# Patient Record
Sex: Female | Born: 1970 | Race: Black or African American | Hispanic: No | Smoking: Never smoker
Health system: Southern US, Community
[De-identification: ages and names within clinical notes are randomized; demographics above are authoritative.]

## PROBLEM LIST (undated history)

## (undated) DIAGNOSIS — D6859 Other primary thrombophilia: Secondary | ICD-10-CM

## (undated) DIAGNOSIS — I2699 Other pulmonary embolism without acute cor pulmonale: Secondary | ICD-10-CM

## (undated) DIAGNOSIS — Z9109 Other allergy status, other than to drugs and biological substances: Secondary | ICD-10-CM

## (undated) DIAGNOSIS — T7840XA Allergy, unspecified, initial encounter: Secondary | ICD-10-CM

## (undated) DIAGNOSIS — I82409 Acute embolism and thrombosis of unspecified deep veins of unspecified lower extremity: Secondary | ICD-10-CM

## (undated) HISTORY — DX: Allergy, unspecified, initial encounter: T78.40XA

---

## 1999-04-13 ENCOUNTER — Emergency Department (HOSPITAL_COMMUNITY): Admission: EM | Admit: 1999-04-13 | Discharge: 1999-04-14 | Payer: Self-pay | Admitting: *Deleted

## 1999-05-29 ENCOUNTER — Emergency Department (HOSPITAL_COMMUNITY): Admission: EM | Admit: 1999-05-29 | Discharge: 1999-05-29 | Payer: Self-pay | Admitting: *Deleted

## 2001-06-11 ENCOUNTER — Other Ambulatory Visit: Admission: RE | Admit: 2001-06-11 | Discharge: 2001-06-11 | Payer: Self-pay | Admitting: *Deleted

## 2001-08-29 ENCOUNTER — Inpatient Hospital Stay (HOSPITAL_COMMUNITY): Admission: EM | Admit: 2001-08-29 | Discharge: 2001-09-04 | Payer: Self-pay | Admitting: Emergency Medicine

## 2001-09-14 ENCOUNTER — Ambulatory Visit (HOSPITAL_COMMUNITY): Admission: RE | Admit: 2001-09-14 | Discharge: 2001-09-14 | Payer: Self-pay

## 2001-09-21 ENCOUNTER — Ambulatory Visit (HOSPITAL_COMMUNITY): Admission: RE | Admit: 2001-09-21 | Discharge: 2001-09-21 | Payer: Self-pay | Admitting: *Deleted

## 2002-04-18 ENCOUNTER — Other Ambulatory Visit: Admission: RE | Admit: 2002-04-18 | Discharge: 2002-04-18 | Payer: Self-pay | Admitting: Internal Medicine

## 2003-05-21 ENCOUNTER — Other Ambulatory Visit: Admission: RE | Admit: 2003-05-21 | Discharge: 2003-05-21 | Payer: Self-pay | Admitting: Obstetrics and Gynecology

## 2004-06-30 ENCOUNTER — Other Ambulatory Visit: Admission: RE | Admit: 2004-06-30 | Discharge: 2004-06-30 | Payer: Self-pay | Admitting: Obstetrics and Gynecology

## 2005-04-19 ENCOUNTER — Inpatient Hospital Stay (HOSPITAL_COMMUNITY): Admission: AD | Admit: 2005-04-19 | Discharge: 2005-04-20 | Payer: Self-pay | Admitting: *Deleted

## 2005-08-09 ENCOUNTER — Other Ambulatory Visit: Admission: RE | Admit: 2005-08-09 | Discharge: 2005-08-09 | Payer: Self-pay | Admitting: Obstetrics and Gynecology

## 2006-10-23 ENCOUNTER — Emergency Department (HOSPITAL_COMMUNITY): Admission: EM | Admit: 2006-10-23 | Discharge: 2006-10-23 | Payer: Self-pay | Admitting: Emergency Medicine

## 2007-08-18 ENCOUNTER — Emergency Department (HOSPITAL_COMMUNITY): Admission: EM | Admit: 2007-08-18 | Discharge: 2007-08-18 | Payer: Self-pay | Admitting: Emergency Medicine

## 2007-10-03 ENCOUNTER — Encounter: Admission: RE | Admit: 2007-10-03 | Discharge: 2007-10-03 | Payer: Self-pay | Admitting: Obstetrics and Gynecology

## 2007-10-12 ENCOUNTER — Encounter (INDEPENDENT_AMBULATORY_CARE_PROVIDER_SITE_OTHER): Payer: Self-pay | Admitting: Diagnostic Radiology

## 2007-10-12 ENCOUNTER — Encounter: Admission: RE | Admit: 2007-10-12 | Discharge: 2007-10-12 | Payer: Self-pay | Admitting: Obstetrics and Gynecology

## 2007-10-26 ENCOUNTER — Emergency Department (HOSPITAL_COMMUNITY): Admission: EM | Admit: 2007-10-26 | Discharge: 2007-10-27 | Payer: Self-pay | Admitting: Emergency Medicine

## 2008-02-10 ENCOUNTER — Encounter (INDEPENDENT_AMBULATORY_CARE_PROVIDER_SITE_OTHER): Payer: Self-pay | Admitting: Emergency Medicine

## 2008-02-10 ENCOUNTER — Emergency Department (HOSPITAL_COMMUNITY): Admission: EM | Admit: 2008-02-10 | Discharge: 2008-02-10 | Payer: Self-pay | Admitting: Emergency Medicine

## 2008-02-10 ENCOUNTER — Ambulatory Visit: Payer: Self-pay | Admitting: *Deleted

## 2009-12-29 IMAGING — CT CT ABDOMEN W/ CM
2 of 5 series · 17 of 46 positions shown, 19 images · IV contrast (APPLIED)
Comparison: None.

ABDOMEN CT WITH CONTRAST:

CLINICAL DATA: Right-sided abdominal pain. Nausea. Fever. Question appendicitis.
TECHNIQUE: Multidetector CT imaging of the abdomen and pelvis was performed
following the standard protocol during bolus administration of intravenous
contrast.

Contrast:  125 cc Omnipaque 300

[Series 2: abd_pel 5.0 b40f st · axial · 0.73mm/px · z∈[+1023,+1453]mm · 14 of 96 slices shown, 16 images]
[im 5/96  soft-tissue]
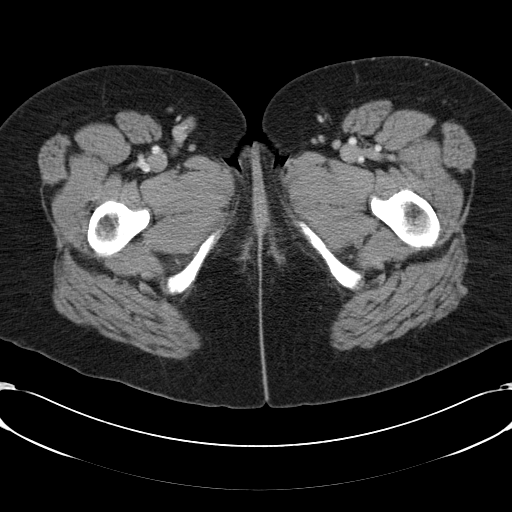
[im 5/96  bone]
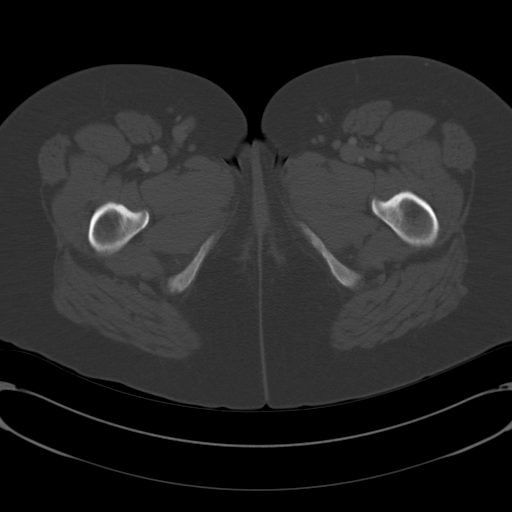
[im 14/96  soft-tissue]
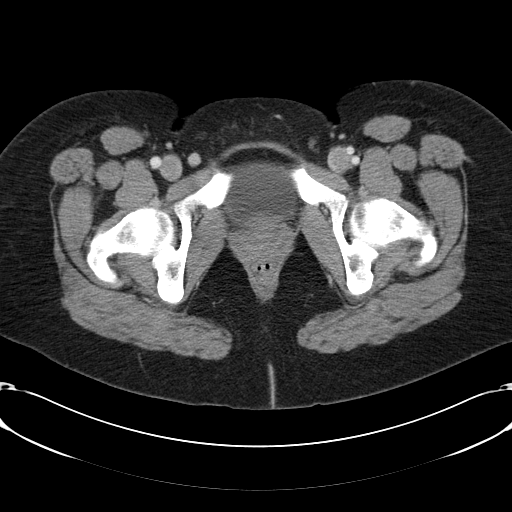
[im 19/96  soft-tissue]
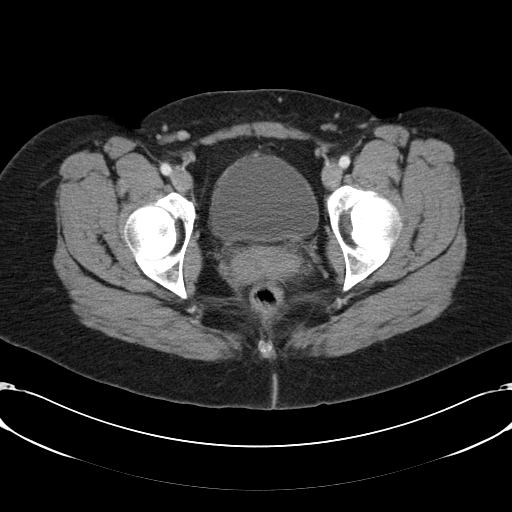
[im 28/96  soft-tissue]
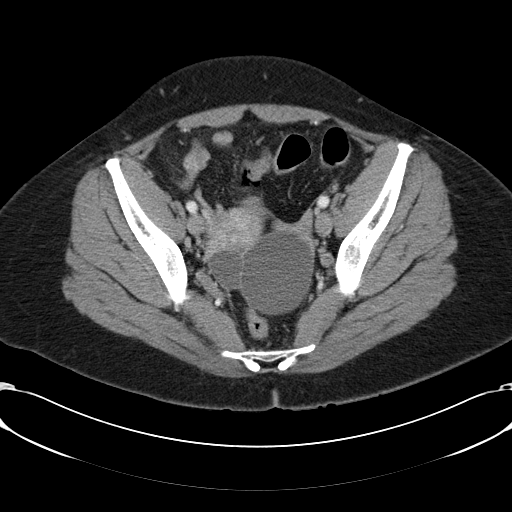
[im 32/96  soft-tissue]
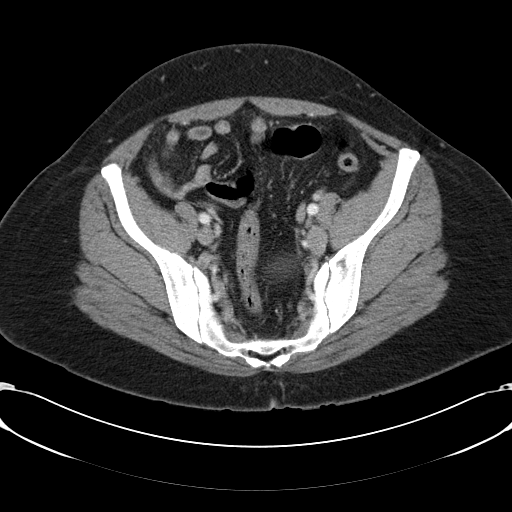
[im 37/96  soft-tissue]
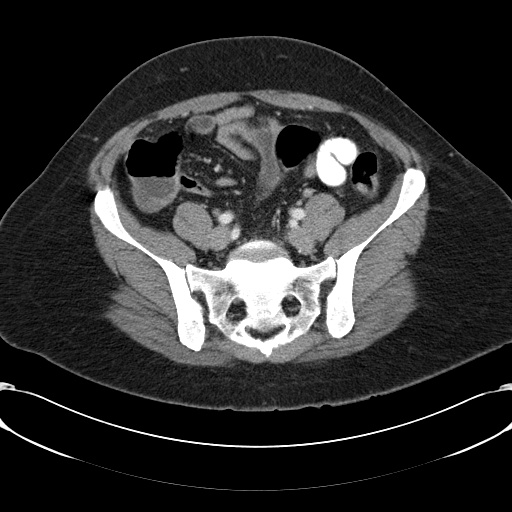
[im 46/96  soft-tissue]
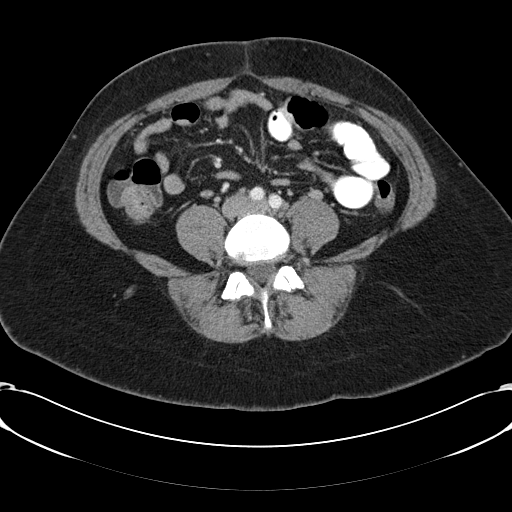
[im 50/96  soft-tissue]
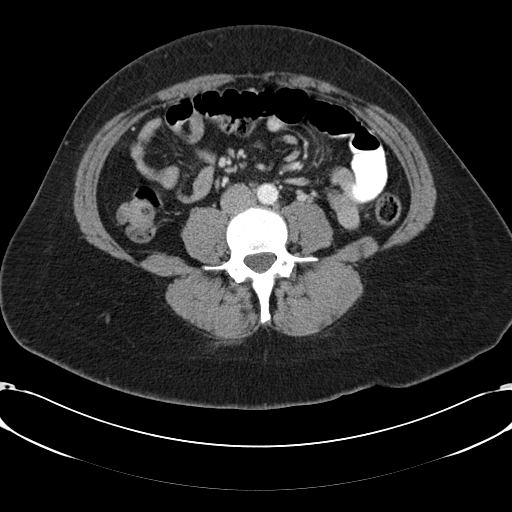
[im 59/96  soft-tissue]
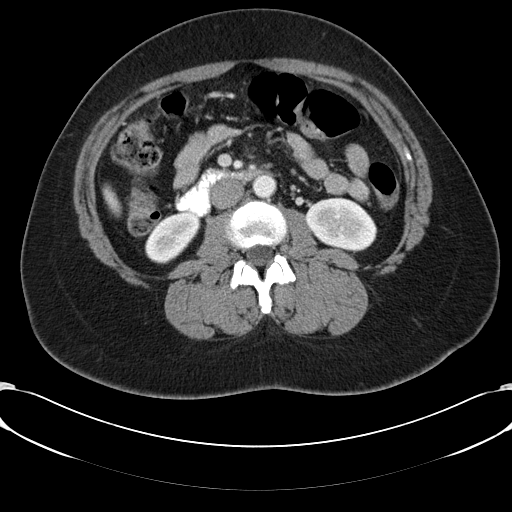
[im 59/96  bone]
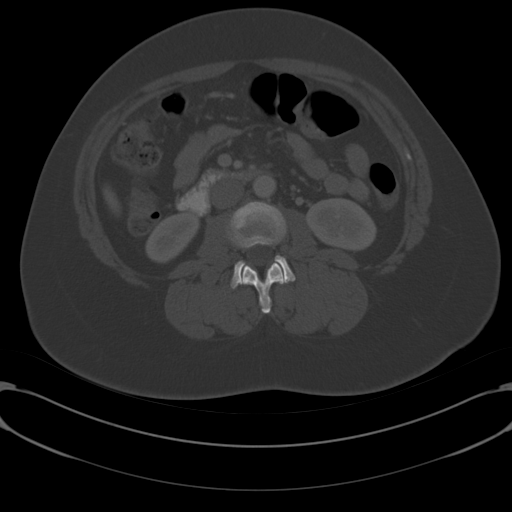
[im 64/96  soft-tissue]
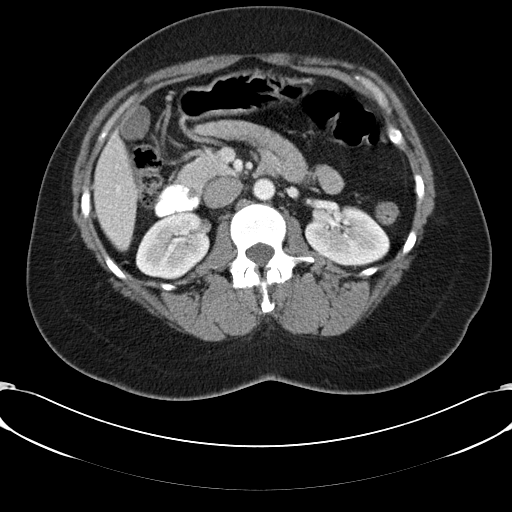
[im 73/96  soft-tissue]
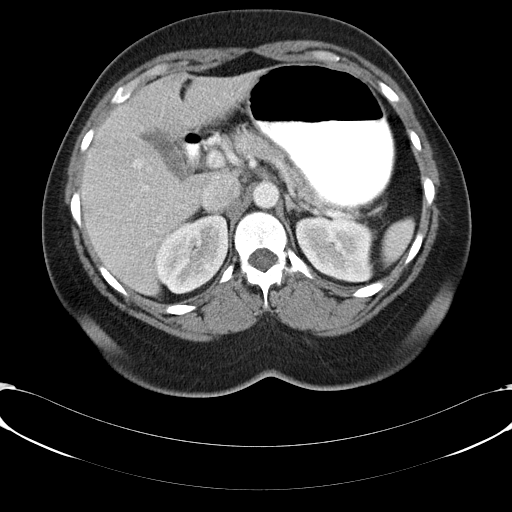
[im 77/96  soft-tissue]
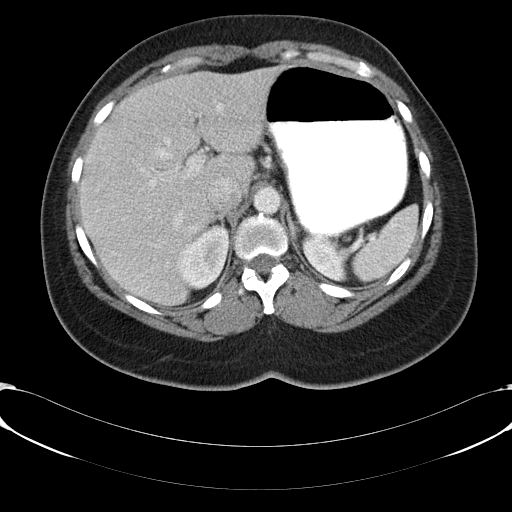
[im 82/96  soft-tissue]
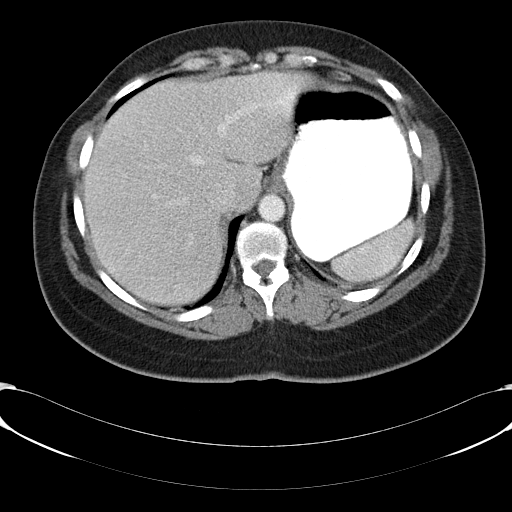
[im 91/96  soft-tissue]
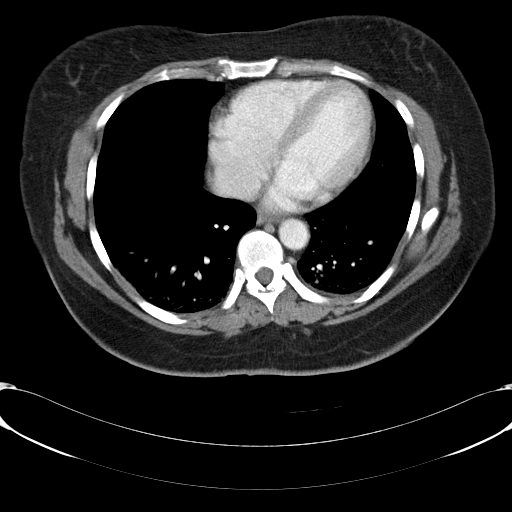

[Series 602: coronal · coronal · 0.97mm/px · 3 of 67 slices shown]
[im 23/67  soft-tissue]
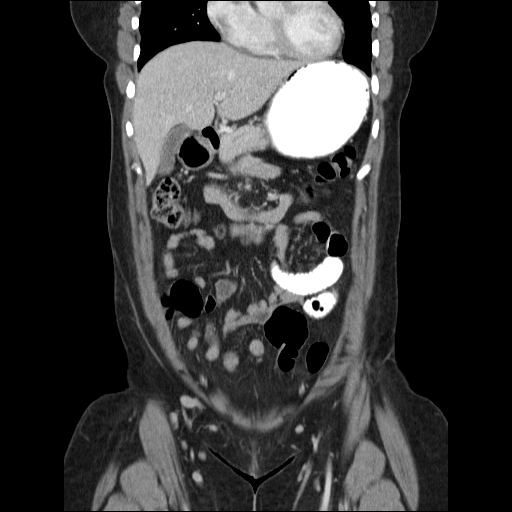
[im 30/67  soft-tissue]
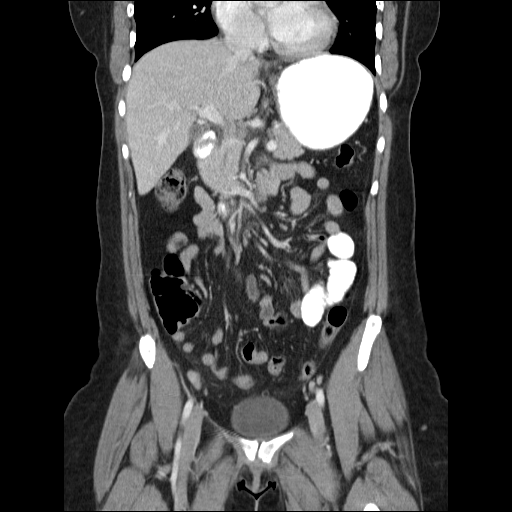
[im 37/67  soft-tissue]
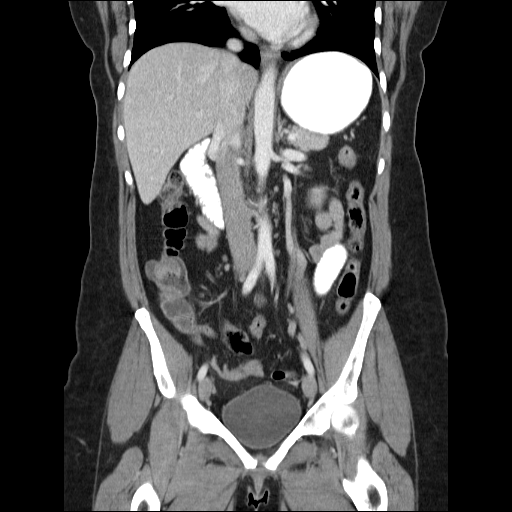

[17 of 46 positions shown; findings below may reference images not displayed]

FINDINGS: Dependent atelectasis is seen in the lung bases. No focal abnormality
is seen in the liver or spleen. The stomach, duodenum, pancreas, gallbladder,
adrenal glands, and kidneys have normal imaging features.

There is no intraperitoneal free fluid. No abdominal lymphadenopathy. The
abdominal bowel loops have normal imaging features.
IMPRESSION: No acute findings.

PELVIS CT WITH CONTRAST:
FINDINGS: There is no intraperitoneal free fluid. No pelvic side wall
lymphadenopathy. IUD is visualized within the uterus. 5.1 x 6.5 cm cystic lesion
is identified in the posterior left adnexal space. Adjacent smaller 2.4 x 2.2 cm
cyst just to the right. Basically, these 2 cystic areas are in the cul-de-sac,
but are slightly more to the left than the right.

The appendix is normal. The terminal ileum is normal. Bladder is unremarkable.

Bone windows are unremarkable.
IMPRESSION: Nearly 7 cm cystic lesion is identified in the left posterolateral left adnexal
space, extending into the cul-de-sac. Just to the right of this is a second
cm cystic lesion. These are probably from the left ovary, but ultrasound exam
would prove helpful to further characterize. This ultrasound evaluation could be
performed in the nonemergent fashion.

Normal appearing terminal ileum and appendix.

No ascites or lymphadenopathy in the pelvis.

## 2010-01-12 ENCOUNTER — Emergency Department (HOSPITAL_COMMUNITY): Admission: EM | Admit: 2010-01-12 | Discharge: 2010-01-12 | Payer: Self-pay | Admitting: Emergency Medicine

## 2010-03-23 ENCOUNTER — Ambulatory Visit: Payer: Self-pay | Admitting: Internal Medicine

## 2010-04-06 ENCOUNTER — Ambulatory Visit: Payer: Self-pay | Admitting: Family Medicine

## 2010-04-27 ENCOUNTER — Encounter (INDEPENDENT_AMBULATORY_CARE_PROVIDER_SITE_OTHER): Payer: Self-pay | Admitting: Family Medicine

## 2010-04-27 LAB — CONVERTED CEMR LAB
INR: 1.69 — ABNORMAL HIGH (ref <1.50)
Prothrombin Time: 20.1 s — ABNORMAL HIGH (ref 11.6–15.2)

## 2010-05-05 ENCOUNTER — Encounter (INDEPENDENT_AMBULATORY_CARE_PROVIDER_SITE_OTHER): Payer: Self-pay | Admitting: Family Medicine

## 2010-05-05 LAB — CONVERTED CEMR LAB: Prothrombin Time: 25.4 s — ABNORMAL HIGH (ref 11.6–15.2)

## 2010-05-13 ENCOUNTER — Ambulatory Visit: Payer: Self-pay | Admitting: Surgery

## 2010-05-13 ENCOUNTER — Encounter (INDEPENDENT_AMBULATORY_CARE_PROVIDER_SITE_OTHER): Payer: Self-pay | Admitting: Emergency Medicine

## 2010-05-13 ENCOUNTER — Emergency Department (HOSPITAL_COMMUNITY): Admission: EM | Admit: 2010-05-13 | Discharge: 2010-05-13 | Payer: Self-pay | Admitting: Emergency Medicine

## 2010-05-28 ENCOUNTER — Encounter (INDEPENDENT_AMBULATORY_CARE_PROVIDER_SITE_OTHER): Payer: Self-pay | Admitting: Family Medicine

## 2010-05-28 LAB — CONVERTED CEMR LAB
INR: 1.91 — ABNORMAL HIGH (ref <1.50)
Prothrombin Time: 22 s — ABNORMAL HIGH (ref 11.6–15.2)

## 2010-06-04 ENCOUNTER — Encounter (INDEPENDENT_AMBULATORY_CARE_PROVIDER_SITE_OTHER): Payer: Self-pay | Admitting: Family Medicine

## 2010-06-04 LAB — CONVERTED CEMR LAB
ALT: 15 units/L (ref 0–35)
AST: 17 units/L (ref 0–37)
Basophils Absolute: 0 10*3/uL (ref 0.0–0.1)
Basophils Relative: 1 % (ref 0–1)
CO2: 26 meq/L (ref 19–32)
Cholesterol: 170 mg/dL (ref 0–200)
Creatinine, Ser: 0.76 mg/dL (ref 0.40–1.20)
Eosinophils Relative: 1 % (ref 0–5)
HCT: 38.4 % (ref 36.0–46.0)
HDL: 49 mg/dL (ref >39)
Lymphocytes Relative: 44 % (ref 12–46)
MCHC: 33.3 g/dL (ref 30.0–36.0)
Neutro Abs: 1.5 10*3/uL — ABNORMAL LOW (ref 1.7–7.7)
Platelets: 264 10*3/uL (ref 150–400)
RDW: 13.8 % (ref 11.5–15.5)
Sodium: 142 meq/L (ref 135–145)
Total Bilirubin: 0.3 mg/dL (ref 0.3–1.2)
Total Protein: 6.8 g/dL (ref 6.0–8.3)

## 2010-06-25 ENCOUNTER — Ambulatory Visit (HOSPITAL_COMMUNITY)
Admission: RE | Admit: 2010-06-25 | Discharge: 2010-06-25 | Payer: Self-pay | Source: Home / Self Care | Admitting: Family Medicine

## 2010-07-02 ENCOUNTER — Encounter (INDEPENDENT_AMBULATORY_CARE_PROVIDER_SITE_OTHER): Payer: Self-pay | Admitting: Family Medicine

## 2010-08-15 ENCOUNTER — Encounter: Payer: Self-pay | Admitting: General Surgery

## 2010-10-06 LAB — URINALYSIS, ROUTINE W REFLEX MICROSCOPIC
Nitrite: NEGATIVE
Specific Gravity, Urine: 1.02 (ref 1.005–1.030)
Urobilinogen, UA: 1 mg/dL (ref 0.0–1.0)

## 2010-10-06 LAB — PROTIME-INR
INR: 3.57 — ABNORMAL HIGH (ref 0.00–1.49)
Prothrombin Time: 35.7 seconds — ABNORMAL HIGH (ref 11.6–15.2)

## 2010-10-10 LAB — PROTIME-INR
INR: 1.8 — ABNORMAL HIGH (ref 0.00–1.49)
Prothrombin Time: 20.7 seconds — ABNORMAL HIGH (ref 11.6–15.2)

## 2010-12-10 NOTE — Cardiovascular Report (Signed)
NAMEEDNAMAE, SCHIANO               ACCOUNT NO.:  [id number]   MEDICAL RECORD NO.:  [id number]          PATIENT TYPE:  INP   LOCATION:  3710                         FACILITY:  [facility]   PHYSICIAN:  Darlin Priestly, MD  DATE OF BIRTH:  [birth year]   DATE OF PROCEDURE:  04/20/2005  DATE OF DISCHARGE:                              CARDIAC CATHETERIZATION   PROCEDURE:  1.  Left heart catheterization.  2.  Coronary angiography.  3.  Left ventriculogram.   ATTENDING PHYSICIAN:  Darlin Priestly, MD   COMPLICATIONS:  None.   INDICATIONS:  Ms. Oats is a 40 year old female, a patient of Dr. Ricki Miller as  well as Dr. Arlan Organ, with a history of protein S deficiency on chronic  Coumadin therapy with a history of bilateral pulmonary embolism while on  birth control pills with repeat PE in 2003. She had had a Cardiolite scan in  2003 secondary to complaint of chest pain which revealed no significant  ischemia with a normal EF. Recently she had complained of recurrent chest  pain and on a repeat Cardiolite scan on March 18, 2005, suggesting anterior  wall thinning with mild inferolateral ischemia extending to the apex.  This  was present on prior Cardiolite. Because of her ongoing symptoms and change  in her Cardiolite, she is now brought in for cardiac catheterization to rule  out significant CAD.   DESCRIPTION OF PROCEDURE:  After obtaining informed consent, the patient was  brought to the cardiac catheterization lab where the right and left groin  were shaved and draped in the usual sterile fashion. Anesthesia monitor was  established. Using modified Seldinger technique, the 6-French arterial  sheath was inserted in the right femoral artery. A 6-French diagnostic  catheter was used to perform a diagnostic angiography.   The left main is a large vessel with no significant disease.   The LAD is a large vessel, which courses the apex and the first diagonal  branch. There is no  significant disease in the LAD.   The diagonal is a medium size vessel with no significant disease.   The left coronary artery gives rise to a medium size ramus intermedius with  no significant disease.   The left circumflex size vessel is a medium size vessel which courses the AV  groove and gives rise to three obtuse marginal branches. The AV groove  circumflex has no significant disease.   The first OM is a small vessel with no significant disease.   The second and third OMs are medium size vessels with no significant  disease.   The right coronary artery is a large vessel that is dominant gives rise to a  PDA as well as posterolateral branch. There is no significant disease in the  RCA, PDA, or posterolateral branch.   The left ventriculogram reveals a preserved EF of 50%.   HEMODYNAMIC SYSTEM:  Left arterial pressure 122/79, LV systolic pressure  119/10, LVEDP 19.   CONCLUSION:  1.  No significant coronary artery disease.  2.  Normal left ventricular systolic function.      Al Decant.  Jenne Campus, MD  Electronically Signed     RHM/MEDQ  D:  04/20/2005  T:  04/20/2005  Job:  [id number]   cc:   Juline Patch, M.D.  Fax: [phone]   Rose Phi. Myna Hidalgo, M.D.  Fax: [phone]

## 2010-12-10 NOTE — Discharge Summary (Signed)
Belinda Adams, Belinda Adams NO.:  [id number]   MEDICAL RECORD NO.:  [id number]          PATIENT TYPE:  INP   LOCATION:  3710                         FACILITY:  [facility]   PHYSICIAN:  Darlin Priestly, MD  DATE OF BIRTH:  06-07-1971   DATE OF ADMISSION:  04/19/2005  DATE OF DISCHARGE:  04/20/2005                                 DISCHARGE SUMMARY   Belinda Adams is a 40 year old African American female patient of Belinda Adams, who came into the hospital for a heparin to Coumadin crossover  and for cardiac catheterization.  She has a prior medical history of protein  S deficiency followed by Belinda Adams.  Her Coumadin is followed by Dr.  [facility].  She has a history of bilateral pulmonary embolism on  birth control pills and subsequently placed on Depo-Provera with repeat  pulmonary embolism in 2003.  She has recently been seen in the office  with  complaints of chest pain and fullness.  She underwent a 2-D echocardiogram  which revealed normal LV size and function.  Only mild valvular  abnormalities with mild TR and a trace MR.  She then underwent a Cardiolite  March 18, 2005, and she had anterior wall thinning, probably secondary to  breast attenuation and some possible inferolateral wall ischemia extending  from the mid ventricle to the apex.  She was recommended to undergo  catheterization.  This was performed on April 20, 2005. Her INR was down  low.  She had normal coronary arteries and an EF of 50%.  Belinda Adams  decided that she could be discharged home later in the day after her bed  rest was up, on Lovenox to Coumadin.  She has given herself Lovenox  injections in the past.   LABORATORY DATA:  April 20, 2005, INR 1.5.  April 19, 2005, INR was  1.8.  Hemoglobin was 11.9, hematocrit 35.6, wbc 3.4, platelets 246.  PTT was  28.  Sodium 137, potassium 3.2, chloride 108, BUN 6, creatinine 0.6.   DISCHARGE MEDICATIONS:  1.  Lovenox 90 mg  subcu every 12 hours until her INR is therapeutic.  2.  Toprol 25 mg a day.  3.  Coumadin 5 mg every day except 7 mg on Wednesday.  4.  Prilosec 20 mg every day.  5.  Afrin nasal spray.  6.  Allegra when needed.  7.  K-Dur 20 mEq every day.  K-Dur was added this admission secondary to her      low potassium.  She states she has had low potassium in the past.  We      will need to recheck her potassium as an outpatient.   DISCHARGE DIAGNOSES:  1.  Chest pain, not coronary ischemia related, status post cardiac      catheterization on April 20, 2005, showing normal coronary arteries      and ejection fraction of 50%.  2.  Protein S deficiency followed by Belinda Adams.  3.  Chronic Coumadin followed by [facility].  We will check her      protime on Saturday morning  at the hospital labs and it will be called      to the P.A. on call and directions will be given if she should continue      her Lovenox.  4.  History of deep venous thrombosis and pulmonary embolism, recurrent on      birth control pills and Depo-Provera.  5.  History of nonsustained ventricular tachycardia.      Belinda Adams, N.P.      Darlin Priestly, MD  Electronically Signed    BB/MEDQ  D:  04/20/2005  T:  04/21/2005  Job:  [id number]   cc:   Belinda Phi. Myna Adams, M.D.  Fax: [phone]   Belinda Patch, M.D.  Fax: [phone]

## 2010-12-10 NOTE — Discharge Summary (Signed)
[facility]  Patient:    SETAREH, ROM Visit Number: [id number] MRN: [id number]          Service Type: MED Location: 3W [phone] Attending Physician:  Noland Fordyce Dictated by:   Lilia Pro, M.D. Admit Date:  08/29/2001 Discharge Date: 09/04/2001                             Discharge Summary  DISCHARGE DIAGNOSES: 1. Bilateral pulmonary embolism secondary to protein S deficiency. 2. Ventricular tachycardia, 39 beat run. 3. Sinusitis.  CONSULTANTS: 1. Dr. Tresa Endo for cardiology. 2. Dr. Arlan Organ for hematology.  HISTORY:  This is a 40 year old black female who was under my primary care who presented with pleuritic chest pain to the office.  A STAT CT scan showed her to have bilateral pulmonary emboli.  Incidentally, she was noted to have the same presentation back in 1996 at which time she was told it was due to her birth control pills.  She was therefore placed on Depo-Provera in the ensuing interim.  HOSPITAL COURSE:  The patient was admitted to a telemetry bed and IV heparin was started.  Prior to the IV heparin being initiated, protein S, C and factor V levels were drawn.  Coumadin was started on day two.  The patients chest pain was treated with Vicodin which she tolerated quite well.  She did well until hospital day three or four when she developed a 39 beat run of ventricular tachycardia.  Cardiology was consulted and determined this was likely due to her hypokalemia.  Her magnesium level was normal but her potassium level was 2.9.  Her potassium was replaced p.o. which she did very well with, and she was started on Toprol XL at 25 mg q.d.  Initial plans were to discharge her on 09/03/01, however, cardiology felt we should at least wait another day.  Her 2D echocardiogram was pending at the time.  After much discussion, we convinced the patient to wait one more day which she did do. Her heart rate remained in sinus rhythm with a  rate of 60s to 80s.  She was discharged on 09/04/01 in stable condition.  Potassium level was 3.9 and INR was 1.8; this had been 2.0 the previous day.  They therefore increased her Coumadin to 7 mg on discharge.  I will check her protime in the morning in my office.  I will also check a potassium level in the morning in my office.  I have instructed her to eat orange juice, bananas or baked potatoes today to help keep her potassium level up.  I will have her follow up with Dr. Tresa Endo in one week.  I will have her follow up in my office for blood levels in the morning as well as an office visit in two days.  The patient agrees with this plan of care.  I have instructed her not to go back to work until next week. Lab work was significant for a protein S level that was substantially low.  We did ask Dr. Myna Hidalgo to see her.  He did recommend checking other hypercoagulable studies, all of which have proved to be negative including antiphospholipid studies.  He agreed that this was a protein S deficiency and she would require lifelong Coumadin therapy.  She has not had any plans to have further children.  If she does, she understands that she should be on Lovenox for this.  He recommended that she have her INR between 2.5 and 3.5. He did not feel that she had any underlying malignancy, had a very low chance for this.  DISCHARGE MEDICATIONS:  Her usual Flonase and Zyrtec as well as Coumadin which I am currently instructing her to take 7 mg p.o. q.d. and Toprol XL 25 mg p.o. q.d.  OTHER:  She has an appointment with an ear, nose and throat specialist for her sinus disease in March.  She may require an allergist with significant allergic rhinitis. Dictated by:   Lilia Pro, M.D. Attending Physician:  Noland Fordyce DD:  09/04/01 TD:  09/04/01 Job: [id number]

## 2010-12-10 NOTE — Consult Note (Signed)
[facility]  Patient:    Belinda Adams, Belinda Adams Visit Number: [id number] MRN: [id number]          Service Type: MED Location: 3W [phone] Attending Physician:  Noland Fordyce Dictated by:   Rose Phi. Myna Hidalgo, M.D. Admit Date:  08/29/2001   CC:         Lilia Pro, M.D.  Rose Phi. Myna Hidalgo, M.D. at the [facility]   Consultation Report  REASON FOR CONSULTATION:  Pulmonary embolism secondary to protein S deficiency.  HISTORY OF PRESENT ILLNESS:  Ms. Canino is a very nice 40 year old black female who has a past history of DVT back in 1996.  This was of the right leg. This was after she gave birth to her daughter.  She had no problems previously.  She got through her pregnancy without any difficulties.  There is no family history of any type of blood clots.  There is a history of hypertension.  She subsequently was placed on Coumadin for 7 months.  She has been on Provera for the past couple of years.  This has stopped her menstrual cycles.  She has had no problems with Depo-Provera.  She works doing Administrator, arts.  She does sit down a lot.  She says she has been feeling a bit tired lately.  She began to develop some left shoulder and back pain on February 4.  This was not radiating.  It was sort of a sharp pain and seemed to be worse when she took a deep breath.  This worsened over the next several days.  She subsequently had difficulties with shortness of breath.  She sought Dr. Johnsie Kindred.  A CT scan was done at [facility].  This showed a bilateral pulmonary emboli.  She subsequently was admitted for heparinization and ultimately to be placed on Coumadin.  Upon admission, she had lab studies done.  She has surprisingly low count, and her protein S level was quite depressed at 25%.  She had normal protein C and antithrombin III level.  Her CBC was okay.  Her pro time was 13.9 with PTT of 30 seconds.  She has several hypercoaguable  studies pending right now.  She feels good right now.  There is no pain; there is no shortness of breath. She has no cough.  She denies any kind of fevers, sweats, or chills.  There has been no change in bowel or bladder habits.  She denies any kind of bleeding or bruising.  PAST MEDICAL HISTORY:  Chronic sinusitis.  ALLERGIES:  PENICILLIN, DOXYCYCLINE, TETRACYCLINE.  MEDICATIONS: 1. Lovenox 80 mg subcutaneously q.12h. 2. Flonase nasal spray as needed. 3. Claritin 10 mg p.o. q.d. 4. Prednisone 40 mg p.o. q.d.  SOCIAL HISTORY:  There is no history of tobacco use.  She has no alcohol use.  FAMILY HISTORY:  Remarkable for hypertension with her mother; she did have a recent stroke.  There is no obvious history of DVT or pulmonary embolism.  REVIEW OF SYSTEMS:  As stated in History of Present Illness.  No additional findings are noted.  PHYSICAL EXAMINATION:  GENERAL:  Well-developed, well-nourished black female in no obvious distress.  VITAL SIGNS:  Temperature 97.5. pulse 67, respiratory rate 16, blood pressure 111/52.  HEAD AND NECK:  No ocular or oral lesions.  She has no scleral icterus.  There is no mucositis.  The neck is supple with no adenopathy.  LUNGS:  Thyroid is not palpable.  LUNGS:  Clear to percussion and auscultation  bilaterally.  CARDIAC:  Regular rate and rhythm with a normal S1 and S2.  There are no murmurs, rubs, or bruits.  LYMPH NODES:  No palpable axillary lymph nodes.  ABDOMEN:  Soft abdomen with good bowel sounds.  There is no palpable abdominal mass.  There is no palpable hepatosplenomegaly.  BACK:  No tenderness over spine, ribs, or hips.  EXTREMITIES:  No clubbing, cyanosis, or edema.  She has good range of motion of her joints.  She has a negative Homans sign in the legs.  There is no warmth, tenderness, or erythema of her joints.  SKIN:  No rashes, lesions, or petechiae.  IMPRESSION:  Ms. Crutchfield is a 40 year old black female with  pulmonary emboli. She has a history of deep venous thrombosis about seven and one-half to eight years ago.  She does have a very low protein S level.  I suspect that this protein S level is real.  As such, Ms. Desch is going to be committed to life-long anticoagulation.  This is her second episode.  As such, I think that life-long anticoagulation is indicated in her.  We will go ahead and check other hypercoaguable studies.  I just want to make sure that she does not have an additional problem that would trigger a DVT/PE.  I would certainly keep Ms. Bradens pro time with an INR of between 2.5 and 3.5.  I do not think we need to embark upon any chase for underlying malignancy.  I think the finding of any type of cancer would be very, very low.  She has no signs of symptoms to support a diagnosis of an underlying malignancy.  Ms. Plack certainly understands the situation.  She understands that she will need to be on Coumadin life long.  She does not plan to get pregnant down the road.  If so, she will need to be taken off Coumadin and placed on Lovenox.  I spent about an hour and 10 minutes with Ms. Fullenwider this evening talking with her about protein S deficiency, how it occurs, why her blood is hypercoaguable, and why she needs life-long anticoagulation.  Thank you so much for allowing [place] to see Ms. Tretter.  We will follow her up as an outpatient with her prothrombin times. Dictated by:   Rose Phi. Myna Hidalgo, M.D. Attending Physician:  Noland Fordyce DD:  08/31/01 TD:  08/31/01 Job: [id number]

## 2011-04-14 LAB — DIFFERENTIAL
Basophils Absolute: 0
Eosinophils Absolute: 0
Lymphs Abs: 0.6 — ABNORMAL LOW
Neutrophils Relative %: 88 — ABNORMAL HIGH

## 2011-04-14 LAB — URINALYSIS, ROUTINE W REFLEX MICROSCOPIC
Bilirubin Urine: NEGATIVE
Hgb urine dipstick: NEGATIVE
Ketones, ur: NEGATIVE
Protein, ur: NEGATIVE
Urobilinogen, UA: 1

## 2011-04-14 LAB — URINE MICROSCOPIC-ADD ON

## 2011-04-14 LAB — PROTIME-INR
INR: 2.7 — ABNORMAL HIGH
Prothrombin Time: 29.5 — ABNORMAL HIGH

## 2011-04-14 LAB — BASIC METABOLIC PANEL
CO2: 26
Calcium: 9
Creatinine, Ser: 0.73

## 2011-04-14 LAB — CBC
MCV: 90.2
Platelets: 270
WBC: 8

## 2011-04-19 LAB — URINALYSIS, ROUTINE W REFLEX MICROSCOPIC
Bilirubin Urine: NEGATIVE
Hgb urine dipstick: NEGATIVE
Nitrite: NEGATIVE
Protein, ur: NEGATIVE
Specific Gravity, Urine: 1.037 — ABNORMAL HIGH
Urobilinogen, UA: 1

## 2011-04-19 LAB — PROTIME-INR: INR: 3.2 — ABNORMAL HIGH

## 2011-04-19 LAB — PREGNANCY, URINE

## 2011-04-19 LAB — URINE MICROSCOPIC-ADD ON

## 2011-04-22 LAB — PROTIME-INR
INR: 1.7 — ABNORMAL HIGH
Prothrombin Time: 21 — ABNORMAL HIGH

## 2011-04-22 LAB — POCT I-STAT, CHEM 8
Calcium, Ion: 1.23
Chloride: 103
Glucose, Bld: 79
HCT: 45
TCO2: 28

## 2011-04-22 LAB — URINALYSIS, ROUTINE W REFLEX MICROSCOPIC
Glucose, UA: NEGATIVE
Nitrite: NEGATIVE
Specific Gravity, Urine: 1.027
pH: 6

## 2011-04-22 LAB — POCT PREGNANCY, URINE
Operator id: 261601
Preg Test, Ur: NEGATIVE

## 2011-11-10 ENCOUNTER — Encounter (HOSPITAL_COMMUNITY): Payer: Self-pay | Admitting: Emergency Medicine

## 2011-11-10 ENCOUNTER — Emergency Department (HOSPITAL_COMMUNITY)
Admission: EM | Admit: 2011-11-10 | Discharge: 2011-11-10 | Disposition: A | Discharge status: Other Acute Inpatient Hospital | Payer: Self-pay | Source: Home / Self Care

## 2011-11-10 ENCOUNTER — Emergency Department (INDEPENDENT_AMBULATORY_CARE_PROVIDER_SITE_OTHER)
Admission: EM | Admit: 2011-11-10 | Discharge: 2011-11-10 | Disposition: A | Discharge status: Home / Self Care | Payer: Self-pay | Source: Home / Self Care | Attending: Family Medicine | Admitting: Family Medicine

## 2011-11-10 DIAGNOSIS — M25511 Pain in right shoulder: Secondary | ICD-10-CM

## 2011-11-10 DIAGNOSIS — M25519 Pain in unspecified shoulder: Secondary | ICD-10-CM

## 2011-11-10 HISTORY — DX: Other primary thrombophilia: D68.59

## 2011-11-10 MED ORDER — PREDNISONE (PAK) 10 MG PO TABS: ORAL_TABLET | ORAL | Status: DC

## 2011-11-10 NOTE — Discharge Instructions (Signed)
APPLY HEAT THREE TIMES DAILY X 15 MIN. IF SYMPTOMS PERSIST OR RECUR REC ORTHO EVAL

## 2011-11-10 NOTE — ED Provider Notes (Signed)
History     CSN: [id number]  Arrival date & time 11/10/11  1325   First MD Initiated Contact with Patient 11/10/11 1541      Chief Complaint  Patient presents with  . Joint Pain    (Consider location/radiation/quality/duration/timing/severity/associated sxs/prior treatment) HPI Comments: THE PATIENT STATES SHE AWOKE WITH BILAT SHOULDER PAIN AND RIGHT WRIST PAIN. NO KNOWN INJURY. 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 NO NUMBNESS OR TINGLING. HAS TAKEN TYLENOL WITH MINIMAL RELIEF. NO HX OF ARTHRITIS.   The history is provided by the patient.    Past Medical History  Diagnosis Date  . Protein S deficiency     History reviewed. No pertinent past surgical history.  History reviewed. No pertinent family history.  History  Substance Use Topics  . Smoking status: Never Smoker   . Smokeless tobacco: Not on file  . Alcohol Use: No    OB History    Grav Para Term Preterm Abortions TAB SAB Ect Mult Living                  Review of Systems  Constitutional: Negative.   HENT: Negative.   Respiratory: Negative.   Cardiovascular: Negative.   Gastrointestinal: Negative.   Genitourinary: Negative.     Allergies  Doxycycline; Histamine; Metronidazole; Penicillins; and Tetracyclines & related  Home Medications   Current Outpatient Rx  Name Route Sig Dispense Refill  . TYLENOL ARTHRITIS PAIN PO Oral Take by mouth.    Marland Kitchen HYDROXYZINE HCL PO Oral Take by mouth.    Marland Kitchen OVER THE COUNTER MEDICATION  Patient reports she is supposed to be on coumadin, but has been off for nearly a year secondary to loss of insurance.  [facility]    . PREDNISONE (PAK) 10 MG PO TABS  DISP 1 6 DAY TAPER TAKE AS DIRECTED WITH FOOD 21 tablet 0    BP 119/82  Pulse 74  Temp(Src) 98.7 F (37.1 C) (Oral)  Resp 16  SpO2 100%  LMP 11/03/2011  Physical Exam  Nursing note and vitals reviewed. Constitutional: She appears well-developed and  well-nourished. She appears distressed.  Neck: Normal range of motion.  Cardiovascular: Normal rate and regular rhythm.   Pulmonary/Chest: Effort normal and breath sounds normal.  Abdominal: Soft. Bowel sounds are normal. There is no tenderness.  Musculoskeletal:       HANDS APPEAR SLIGHTLY SWOLLEN. GOOD PULSES. PAIN WITH ROM OF THE RIGHT WRIST. PAIN BILAT SHOULDERS R>L. PAIN WITH ROM. PAIN ANT SHOULDER TO PALPATION. NO CREPITANCE. NO SKIN CHANGES.   Skin: Skin is dry.    ED Course  Procedures (including critical care time)  Labs Reviewed - No data to display No results found.   1. Shoulder pain, bilateral       MDM          Randa Spike, MD 11/10/11 [phone]

## 2011-11-10 NOTE — ED Notes (Signed)
C/o right wrist, right and left shoulders are painful.  Onset this morning, no known injury.  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  No history of this before, no fever.  Reports unable to lift up arms to put on shirt, required asisstance this am with dressing

## 2011-11-15 ENCOUNTER — Encounter (HOSPITAL_COMMUNITY): Payer: Self-pay | Admitting: *Deleted

## 2011-11-15 ENCOUNTER — Emergency Department (HOSPITAL_COMMUNITY)
Admission: EM | Admit: 2011-11-15 | Discharge: 2011-11-16 | Disposition: A | Discharge status: Home / Self Care | Payer: Self-pay | Attending: Emergency Medicine | Admitting: Emergency Medicine

## 2011-11-15 DIAGNOSIS — R109 Unspecified abdominal pain: Secondary | ICD-10-CM | POA: Insufficient documentation

## 2011-11-15 DIAGNOSIS — R197 Diarrhea, unspecified: Secondary | ICD-10-CM | POA: Insufficient documentation

## 2011-11-15 DIAGNOSIS — R112 Nausea with vomiting, unspecified: Secondary | ICD-10-CM

## 2011-11-15 DIAGNOSIS — E860 Dehydration: Secondary | ICD-10-CM

## 2011-11-15 NOTE — ED Notes (Signed)
Pt has been having generalized abdominal pain with nausea, vomiting and diarrhea.  Pt states that her lips got numb also.  No fever or chills

## 2011-11-16 LAB — BASIC METABOLIC PANEL
CO2: 25 mEq/L (ref 19–32)
Calcium: 9.6 mg/dL (ref 8.4–10.5)
Creatinine, Ser: 0.74 mg/dL (ref 0.50–1.10)
GFR calc non Af Amer: >90 mL/min (ref >90)
Glucose, Bld: 106 mg/dL — ABNORMAL HIGH (ref 70–99)
Sodium: 137 mEq/L (ref 135–145)

## 2011-11-16 LAB — CBC
MCH: 30.4 pg (ref 26.0–34.0)
MCV: 90.2 fL (ref 78.0–100.0)
Platelets: 287 10*3/uL (ref 150–400)
RBC: 4.61 MIL/uL (ref 3.87–5.11)
RDW: 13.2 % (ref 11.5–15.5)
WBC: 6.5 10*3/uL (ref 4.0–10.5)

## 2011-11-16 LAB — URINALYSIS, ROUTINE W REFLEX MICROSCOPIC
Ketones, ur: 15 mg/dL — AB
Nitrite: NEGATIVE
Urobilinogen, UA: 1 mg/dL (ref 0.0–1.0)

## 2011-11-16 MED ORDER — ONDANSETRON HCL 4 MG/2ML IJ SOLN
4.0000 mg | Freq: Once | INTRAMUSCULAR | Status: AC
  Administered 2011-11-16: 4 mg via INTRAVENOUS
  Filled 2011-11-16: qty 2

## 2011-11-16 MED ORDER — ONDANSETRON 4 MG PO TBDP: 4.0000 mg | ORAL_TABLET | Freq: Three times a day (TID) | ORAL | Status: AC | PRN

## 2011-11-16 MED ORDER — SODIUM CHLORIDE 0.9 % IV BOLUS (SEPSIS)
1000.0000 mL | Freq: Once | INTRAVENOUS | Status: AC
  Administered 2011-11-16: 1000 mL via INTRAVENOUS

## 2011-11-16 MED ORDER — PROMETHAZINE HCL 25 MG PO TABS: 25.0000 mg | ORAL_TABLET | Freq: Four times a day (QID) | ORAL | Status: DC | PRN

## 2011-11-16 MED ORDER — HYDROMORPHONE HCL PF 1 MG/ML IJ SOLN
1.0000 mg | Freq: Once | INTRAMUSCULAR | Status: AC
  Administered 2011-11-16: 1 mg via INTRAVENOUS
  Filled 2011-11-16: qty 1

## 2011-11-16 NOTE — ED Notes (Signed)
Waiting on discharge paperwork from [facility]P. 

## 2011-11-16 NOTE — Discharge Instructions (Signed)
Return to the hospital for severe or worsening symptoms, use Zofran or Phenergan for nausea or vomiting. Drink plenty of fluids.

## 2011-11-16 NOTE — ED Notes (Signed)
Patient given discharge paperwork; went over discharge instructions with patient.  Patient instructed to take Zofran and Phenergan as directed, to follow up with her primary care physician/referral, and to return to the ED for new, worsening, or concerning symptoms.

## 2011-11-16 NOTE — ED Provider Notes (Signed)
History     CSN: [id number]  Arrival date & time 11/15/11  2321   First MD Initiated Contact with Patient 11/16/11 0030      Chief Complaint  Patient presents with  . Emesis  . Abdominal Pain    (Consider location/radiation/quality/duration/timing/severity/associated sxs/prior treatment) HPI Comments: 41 year old female with history of protein S deficiency who presents with a complaint of nausea vomiting and diarrhea. She states this started approximately 12 hours ago, has been persistent, has been watery diarrhea x6 episodes, vomiting x6-7 episodes. She denies associated fevers chills back pain dysuria chest pain cough shortness of breath. The symptoms are persistent throughout the day, she has tried to take her medications including hydroxyzine with no significant improvement. 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  Patient is a 41 y.o. female presenting with vomiting and abdominal pain. The history is provided by the patient and medical records.  Emesis  Associated symptoms include abdominal pain.  Abdominal Pain The primary symptoms of the illness include abdominal pain and vomiting.    Past Medical History  Diagnosis Date  . Protein S deficiency     History reviewed. No pertinent past surgical history.  No family history on file.  History  Substance Use Topics  . Smoking status: Never Smoker   . Smokeless tobacco: Not on file  . Alcohol Use: No    OB History    Grav Para Term Preterm Abortions TAB SAB Ect Mult Living                  Review of Systems  Gastrointestinal: Positive for vomiting and abdominal pain.  All other systems reviewed and are negative.    Allergies  Doxycycline; Histamine; Metronidazole; Penicillins; and Tetracyclines & related  Home Medications   Current Outpatient Rx  Name Route Sig Dispense Refill  . ACETAMINOPHEN ER 650 MG PO TBCR Oral  Take 650 mg by mouth every 8 (eight) hours as needed. Arthritis formula  For wrist pain    . HYDROXYZINE HCL 10 MG PO TABS Oral Take 10 mg by mouth 3 (three) times daily as needed. allergies    . MOMETASONE FUROATE 50 MCG/ACT NA SUSP Nasal Place 2 sprays into the nose daily. allergies    . PREDNISONE (PAK) 10 MG PO TABS  DISP 1 6 DAY TAPER TAKE AS DIRECTED WITH FOOD 21 tablet 0  . ONDANSETRON 4 MG PO TBDP Oral Take 1 tablet (4 mg total) by mouth every 8 (eight) hours as needed for nausea. 10 tablet 0  . PROMETHAZINE HCL 25 MG PO TABS Oral Take 1 tablet (25 mg total) by mouth every 6 (six) hours as needed for nausea. 12 tablet 0    BP 128/78  Pulse 68  Temp(Src) 98.4 F (36.9 C) (Oral)  Resp 20  SpO2 100%  LMP 11/03/2011  Physical Exam  Nursing note and vitals reviewed. Constitutional: She appears well-developed and well-nourished.       Uncomfortable appearing  HENT:  Head: Normocephalic and atraumatic.  Mouth/Throat: Oropharynx is clear and moist. No oropharyngeal exudate.  Eyes: Conjunctivae and EOM are normal. Pupils are equal, round, and reactive to light. Right eye exhibits no discharge. Left eye exhibits no discharge. No scleral icterus.  Neck: Normal range of motion. Neck supple. No JVD present. No thyromegaly present.  Cardiovascular: Normal rate, regular rhythm, normal heart sounds and intact distal pulses.  Exam reveals no gallop and  no friction rub.   No murmur heard. Pulmonary/Chest: Effort normal and breath sounds normal. No respiratory distress. She has no wheezes. She has no rales.  Abdominal: Soft. Bowel sounds are normal. She exhibits no distension and no mass. There is tenderness ( Mild diffuse crampy tenderness, no guarding, non-peritoneal).  Musculoskeletal: Normal range of motion. She exhibits no edema and no tenderness.  Lymphadenopathy:    She has no cervical adenopathy.  Neurological: She is alert. Coordination normal.  Skin: Skin is warm and dry. No rash  noted. No erythema.  Psychiatric: She has a normal mood and affect. Her behavior is normal.    ED Course  Procedures (including critical care time)  Labs Reviewed  BASIC METABOLIC PANEL - Abnormal; Notable for the following:    Glucose, Bld 106 (*)    All other components within normal limits  URINALYSIS, ROUTINE W REFLEX MICROSCOPIC - Abnormal; Notable for the following:    Color, Urine AMBER (*) BIOCHEMICALS MAY BE AFFECTED BY COLOR   APPearance CLOUDY (*)    Bilirubin Urine SMALL (*)    Ketones, ur 15 (*)    Leukocytes, UA TRACE (*)    All other components within normal limits  URINE MICROSCOPIC-ADD ON - Abnormal; Notable for the following:    Squamous Epithelial / LPF FEW (*)    Bacteria, UA FEW (*)    Casts HYALINE CASTS (*)    All other components within normal limits  CBC  PREGNANCY, URINE   No results found.   1. Nausea vomiting and diarrhea   2. Dehydration       MDM  Patient has what appears to be a viral gastroenteritis, vital signs currently reflect no tachycardia fever or hypotension, will aggressively rehydrate, nausea medications, reevaluate.  Laboratory data reviewed showing normal metabolic panel, normal renal function, normal blood counts.  Reevaluation at 1:30 AM, patient remains with normal vital signs, Zofran has completely alleviated nausea and IV fluids have been given.  Has likely contracted a viral gastroenteritis, doubt other etiology at this time.  Zofran, IV fluids, hydromorphone given for pain  Discharge Prescriptions include:  #1 Zofran  #2 Phenergan        Vida Roller, MD 11/16/11 [phone]

## 2012-01-06 ENCOUNTER — Other Ambulatory Visit: Payer: Self-pay | Admitting: Obstetrics and Gynecology

## 2012-04-02 ENCOUNTER — Ambulatory Visit: Payer: Self-pay

## 2012-04-05 ENCOUNTER — Ambulatory Visit: Payer: Self-pay | Admitting: Internal Medicine

## 2012-05-05 ENCOUNTER — Encounter (HOSPITAL_COMMUNITY): Payer: Self-pay | Admitting: Emergency Medicine

## 2012-05-05 ENCOUNTER — Emergency Department (HOSPITAL_COMMUNITY)
Admission: EM | Admit: 2012-05-05 | Discharge: 2012-05-05 | Disposition: A | Discharge status: Home / Self Care | Payer: BC Managed Care – PPO | Attending: Emergency Medicine | Admitting: Emergency Medicine

## 2012-05-05 DIAGNOSIS — M549 Dorsalgia, unspecified: Secondary | ICD-10-CM | POA: Insufficient documentation

## 2012-05-05 DIAGNOSIS — D6859 Other primary thrombophilia: Secondary | ICD-10-CM | POA: Insufficient documentation

## 2012-05-05 DIAGNOSIS — Z888 Allergy status to other drugs, medicaments and biological substances status: Secondary | ICD-10-CM | POA: Insufficient documentation

## 2012-05-05 MED ORDER — IBUPROFEN 600 MG PO TABS: 600.0000 mg | ORAL_TABLET | Freq: Four times a day (QID) | ORAL | Status: DC | PRN

## 2012-05-05 MED ORDER — DIAZEPAM 5 MG PO TABS
10.0000 mg | ORAL_TABLET | Freq: Once | ORAL | Status: AC
  Administered 2012-05-05: 10 mg via ORAL
  Filled 2012-05-05: qty 2

## 2012-05-05 MED ORDER — OXYCODONE-ACETAMINOPHEN 5-325 MG PO TABS
1.0000 | ORAL_TABLET | Freq: Once | ORAL | Status: AC
  Administered 2012-05-05: 1 via ORAL
  Filled 2012-05-05: qty 1

## 2012-05-05 MED ORDER — METHOCARBAMOL 750 MG PO TABS: 750.0000 mg | ORAL_TABLET | Freq: Four times a day (QID) | ORAL | Status: DC

## 2012-05-05 MED ORDER — KETOROLAC TROMETHAMINE 60 MG/2ML IM SOLN
60.0000 mg | Freq: Once | INTRAMUSCULAR | Status: AC
  Administered 2012-05-05: 60 mg via INTRAMUSCULAR
  Filled 2012-05-05: qty 2

## 2012-05-05 MED ORDER — OXYCODONE-ACETAMINOPHEN 5-325 MG PO TABS: 2.0000 | ORAL_TABLET | ORAL | Status: DC | PRN

## 2012-05-05 NOTE — ED Notes (Signed)
Pt states she almost fell at work yesterday but caught herself and had slight pain in lower back yesterday. This morning when she woke pain intense in lower back, right buttock is tight and calf is painful. Difficulty bearing weight and bending.

## 2012-05-05 NOTE — ED Provider Notes (Signed)
History     CSN: [id number]  Arrival date & time 05/05/12  4098   First MD Initiated Contact with Patient 05/05/12 1018      Chief Complaint  Patient presents with  . Back Pain    (Consider location/radiation/quality/duration/timing/severity/associated sxs/prior treatment) Patient is a 41 y.o. female presenting with back pain. The history is provided by the patient.  Back Pain    patient complains of back pain on the right side worse with movement and not associated with urinary symptoms and. Pain radiates down her right leg and she denies any foot drop. Has been using over-the-counter medication without relief. Pain is sharp and described as being like spasms. No change in bowel or bladder function. Denies any perineal numbness  Past Medical History  Diagnosis Date  . Protein S deficiency     History reviewed. No pertinent past surgical history.  No family history on file.  History  Substance Use Topics  . Smoking status: Never Smoker   . Smokeless tobacco: Never Used  . Alcohol Use: No    OB History    Grav Para Term Preterm Abortions TAB SAB Ect Mult Living                  Review of Systems  Musculoskeletal: Positive for back pain.  All other systems reviewed and are negative.    Allergies  Doxycycline; Histamine; Metronidazole; Penicillins; and Tetracyclines & related  Home Medications   Current Outpatient Rx  Name Route Sig Dispense Refill  . ACETAMINOPHEN ER 650 MG PO TBCR Oral Take 650 mg by mouth every 8 (eight) hours as needed. Arthritis formula  For wrist pain    . HYDROXYZINE HCL 10 MG PO TABS Oral Take 10 mg by mouth 3 (three) times daily as needed. allergies    . MOMETASONE FUROATE 50 MCG/ACT NA SUSP Nasal Place 2 sprays into the nose daily. allergies    . PREDNISONE (PAK) 10 MG PO TABS  DISP 1 6 DAY TAPER TAKE AS DIRECTED WITH FOOD 21 tablet 0  . PROMETHAZINE HCL 25 MG PO TABS Oral Take 1 tablet (25 mg total) by mouth every 6 (six) hours as  needed for nausea. 12 tablet 0    BP 117/79  Pulse 72  Temp 97.7 F (36.5 C) (Oral)  Resp 18  SpO2 100%  LMP 04/07/2012  Physical Exam  Nursing note and vitals reviewed. Constitutional: She is oriented to person, place, and time. She appears well-developed and well-nourished.  Non-toxic appearance. No distress.  HENT:  Head: Normocephalic and atraumatic.  Eyes: Conjunctivae normal, EOM and lids are normal. Pupils are equal, round, and reactive to light.  Neck: Normal range of motion. Neck supple. No tracheal deviation present. No mass present.  Cardiovascular: Normal rate, regular rhythm and normal heart sounds.  Exam reveals no gallop.   No murmur heard. Pulmonary/Chest: Effort normal and breath sounds normal. No stridor. No respiratory distress. She has no decreased breath sounds. She has no wheezes. She has no rhonchi. She has no rales.  Abdominal: Soft. Normal appearance and bowel sounds are normal. She exhibits no distension. There is no tenderness. There is no rebound and no CVA tenderness.  Musculoskeletal: Normal range of motion. She exhibits no edema and no tenderness.       Right shoulder: She exhibits pain and spasm.       Arms: Neurological: She is alert and oriented to person, place, and time. She has normal strength. No cranial nerve deficit  or sensory deficit. GCS eye subscore is 4. GCS verbal subscore is 5. GCS motor subscore is 6.  Skin: Skin is warm and dry. No abrasion and no rash noted.  Psychiatric: She has a normal mood and affect. Her speech is normal and behavior is normal.    ED Course  Procedures (including critical care time)  Labs Reviewed - No data to display No results found.   No diagnosis found.    MDM  Pt to be tx for musculoskeletal back pain        Toy Baker, MD 05/05/12 1042

## 2012-11-09 ENCOUNTER — Encounter (HOSPITAL_COMMUNITY): Payer: Self-pay | Admitting: *Deleted

## 2012-11-09 ENCOUNTER — Emergency Department (HOSPITAL_COMMUNITY)
Admission: EM | Admit: 2012-11-09 | Discharge: 2012-11-09 | Disposition: A | Discharge status: Home / Self Care | Payer: Self-pay | Attending: Emergency Medicine | Admitting: Emergency Medicine

## 2012-11-09 DIAGNOSIS — I82401 Acute embolism and thrombosis of unspecified deep veins of right lower extremity: Secondary | ICD-10-CM

## 2012-11-09 DIAGNOSIS — Z79899 Other long term (current) drug therapy: Secondary | ICD-10-CM | POA: Insufficient documentation

## 2012-11-09 DIAGNOSIS — Z7901 Long term (current) use of anticoagulants: Secondary | ICD-10-CM | POA: Insufficient documentation

## 2012-11-09 DIAGNOSIS — I82409 Acute embolism and thrombosis of unspecified deep veins of unspecified lower extremity: Secondary | ICD-10-CM | POA: Insufficient documentation

## 2012-11-09 DIAGNOSIS — M79609 Pain in unspecified limb: Secondary | ICD-10-CM | POA: Insufficient documentation

## 2012-11-09 DIAGNOSIS — Z86711 Personal history of pulmonary embolism: Secondary | ICD-10-CM | POA: Insufficient documentation

## 2012-11-09 DIAGNOSIS — Z8639 Personal history of other endocrine, nutritional and metabolic disease: Secondary | ICD-10-CM | POA: Insufficient documentation

## 2012-11-09 DIAGNOSIS — Z862 Personal history of diseases of the blood and blood-forming organs and certain disorders involving the immune mechanism: Secondary | ICD-10-CM | POA: Insufficient documentation

## 2012-11-09 HISTORY — DX: Other pulmonary embolism without acute cor pulmonale: I26.99

## 2012-11-09 HISTORY — DX: Other allergy status, other than to drugs and biological substances: Z91.09

## 2012-11-09 HISTORY — DX: Acute embolism and thrombosis of unspecified deep veins of unspecified lower extremity: I82.409

## 2012-11-09 LAB — CBC
HCT: 35.6 % — ABNORMAL LOW (ref 36.0–46.0)
Hemoglobin: 12.4 g/dL (ref 12.0–15.0)
MCH: 30.5 pg (ref 26.0–34.0)
MCHC: 34.8 g/dL (ref 30.0–36.0)
MCV: 87.7 fL (ref 78.0–100.0)

## 2012-11-09 LAB — BASIC METABOLIC PANEL
BUN: 11 mg/dL (ref 6–23)
Calcium: 9.4 mg/dL (ref 8.4–10.5)
Creatinine, Ser: 0.7 mg/dL (ref 0.50–1.10)
GFR calc non Af Amer: >90 mL/min (ref >90)
Glucose, Bld: 102 mg/dL — ABNORMAL HIGH (ref 70–99)
Sodium: 139 mEq/L (ref 135–145)

## 2012-11-09 LAB — PROTIME-INR: INR: 1.62 — ABNORMAL HIGH (ref 0.00–1.49)

## 2012-11-09 MED ORDER — OXYCODONE-ACETAMINOPHEN 5-325 MG PO TABS
1.0000 | ORAL_TABLET | Freq: Once | ORAL | Status: AC
  Administered 2012-11-09: 1 via ORAL
  Filled 2012-11-09: qty 1

## 2012-11-09 MED ORDER — WARFARIN - PHYSICIAN DOSING INPATIENT: Freq: Every day | Status: DC

## 2012-11-09 MED ORDER — ENOXAPARIN SODIUM 150 MG/ML ~~LOC~~ SOLN: 1.0000 mg/kg | Freq: Two times a day (BID) | SUBCUTANEOUS | Status: DC

## 2012-11-09 MED ORDER — WARFARIN SODIUM 5 MG PO TABS: 5.0000 mg | ORAL_TABLET | Freq: Every day | ORAL | Status: DC

## 2012-11-09 MED ORDER — WARFARIN SODIUM 5 MG PO TABS
5.0000 mg | ORAL_TABLET | Freq: Once | ORAL | Status: AC
  Administered 2012-11-09: 5 mg via ORAL
  Filled 2012-11-09: qty 1

## 2012-11-09 MED ORDER — ENOXAPARIN SODIUM 100 MG/ML ~~LOC~~ SOLN
1.0000 mg/kg | Freq: Once | SUBCUTANEOUS | Status: AC
  Administered 2012-11-09: 85 mg via SUBCUTANEOUS
  Filled 2012-11-09: qty 1

## 2012-11-09 NOTE — ED Notes (Addendum)
C/o R calf pain, onset Tuesday, gradually progressively worse, pain has moved up behind knee. h/o protein s deficiency, h/o DVT & PE. "Not taking warfarin anymore b/c I don't have an MD", wearing full leg support hose bilaterally. Denies sob or CP. Rates pain 10/10. Worse with walking or standing. Last complication in 2006.

## 2012-11-09 NOTE — ED Notes (Signed)
Pt reports right leg pain x 2 weeks. Has a history of DVTs. Had been taking cuomadin but ran out a few days ago. Pt has a good pedal and popliteal pulse. Right leg swollen, inflamed, hot to the touch. Pt states calf pain that now has moved to back of the knee.

## 2012-11-09 NOTE — ED Provider Notes (Signed)
History     CSN: [id number]  Arrival date & time 11/09/12  2023   First MD Initiated Contact with Patient 11/09/12 2225      Chief Complaint  Patient presents with  . Leg Pain  . Leg Swelling    (Consider location/radiation/quality/duration/timing/severity/associated sxs/prior treatment) Patient is a 42 y.o. female presenting with leg pain. The history is provided by the patient.  Leg Pain Location:  Leg Time since incident:  3 days Injury: no   Leg location:  R leg, R upper leg and R lower leg Pain details:    Quality:  Aching, shooting and throbbing   Severity:  Moderate   Onset quality:  Gradual   Duration:  3 days   Timing:  Constant   Progression:  Worsening Chronicity:  New Relieved by:  Nothing Worsened by:  Nothing tried Ineffective treatments:  None tried Associated symptoms: swelling   Associated symptoms: no decreased ROM, no fever, no muscle weakness and no numbness   Risk factors comment:  History of protein S deficiency is supposed to be on chronic Coumadin therapy but has not taken Coumadin for greater than one year. She also states in the past she has had a DVT in the right lower extremity and this feels the same   Past Medical History  Diagnosis Date  . Protein S deficiency   . DVT (deep venous thrombosis)   . PE (pulmonary embolism)   . Environmental allergies     History reviewed. No pertinent past surgical history.  No family history on file.  History  Substance Use Topics  . Smoking status: Never Smoker   . Smokeless tobacco: Never Used  . Alcohol Use: No    OB History   Grav Para Term Preterm Abortions TAB SAB Ect Mult Living                  Review of Systems  Constitutional: Negative for fever.  Respiratory: Negative for chest tightness and shortness of breath.   Cardiovascular: Positive for leg swelling. Negative for chest pain and palpitations.  All other systems reviewed and are negative.    Allergies  Doxycycline;  Histamine; Latex; Metronidazole; Penicillins; and Tetracyclines & related  Home Medications   Current Outpatient Rx  Name  Route  Sig  Dispense  Refill  . acetaminophen (TYLENOL) 650 MG CR tablet   Oral   Take 650 mg by mouth every 8 (eight) hours as needed for pain.         . hydrOXYzine (ATARAX/VISTARIL) 10 MG tablet   Oral   Take 10 mg by mouth 3 (three) times daily as needed for itching (allergies).          . enoxaparin (LOVENOX) 150 MG/ML injection   Subcutaneous   Inject 0.56 mLs (85 mg total) into the skin every 12 (twelve) hours.   14 Syringe   0   . warfarin (COUMADIN) 5 MG tablet   Oral   Take 1 tablet (5 mg total) by mouth daily.   60 tablet   0     BP 124/76  Pulse 87  Temp(Src) 98.7 F (37.1 C) (Oral)  Resp 16  Ht 5\' 6"  (1.676 m)  Wt 185 lb (83.915 kg)  BMI 29.87 kg/m2  SpO2 100%  LMP 11/04/2012  Physical Exam  Nursing note and vitals reviewed. Constitutional: She is oriented to person, place, and time. She appears well-developed and well-nourished. No distress.  HENT:  Head: Normocephalic and atraumatic.  Mouth/Throat:  Oropharynx is clear and moist.  Eyes: Conjunctivae and EOM are normal. Pupils are equal, round, and reactive to light.  Neck: Normal range of motion. Neck supple.  Cardiovascular: Normal rate, regular rhythm and intact distal pulses.   No murmur heard. Pulmonary/Chest: Effort normal and breath sounds normal. No respiratory distress. She has no wheezes. She has no rales.  Abdominal: Soft. She exhibits no distension. There is no tenderness. There is no rebound and no guarding.  Musculoskeletal: Normal range of motion. She exhibits edema and tenderness.  Right lower ext is edematous, warm and tender to the calf and thigh.  No signs of cellulitis (no erythema)  Neurological: She is alert and oriented to person, place, and time.  Skin: Skin is warm and dry. No rash noted. No erythema.  Psychiatric: She has a normal mood and affect.  Her behavior is normal.    ED Course  Procedures (including critical care time)  Labs Reviewed  PROTIME-INR - Abnormal; Notable for the following:    Prothrombin Time 18.7 (*)    INR 1.62 (*)    All other components within normal limits  APTT - Abnormal; Notable for the following:    aPTT 40 (*)    All other components within normal limits  CBC - Abnormal; Notable for the following:    HCT 35.6 (*)    All other components within normal limits  BASIC METABOLIC PANEL - Abnormal; Notable for the following:    Glucose, Bld 102 (*)    All other components within normal limits   No results found.   1. DVT (deep venous thrombosis), right       MDM   Patient with history of protein S deficiency who is supposed to be on Coumadin however lost her doctor approximately a year ago and has been off Coumadin since. 3 days ago patient noticed swelling and pain to her right leg that is only worsened. She denies any shortness of breath, chest pain or infectious etiology. On exam the patient's right lower strength he is edematous, warm and tender.  CBC and BMP are within normal limits. Coags with an INR of 1.62. Patient given Lovenox and started on Coumadin as she is supposed to be on this medication chronically. Also got her set up to get a ultrasound done tomorrow by vascular lab. She can talk with the social worker tomorrow while she is at vascular lab to help her with Lovenox injections as well as helping her find a regular doctor. Patient was also given the number for the Coumadin clinic.      Gwyneth Sprout, MD 11/09/12 [phone]

## 2012-11-10 ENCOUNTER — Ambulatory Visit (HOSPITAL_COMMUNITY)
Admission: RE | Admit: 2012-11-10 | Discharge: 2012-11-10 | Disposition: A | Discharge status: Home / Self Care | Payer: Self-pay | Source: Ambulatory Visit | Attending: Emergency Medicine | Admitting: Emergency Medicine

## 2012-11-10 ENCOUNTER — Emergency Department (HOSPITAL_COMMUNITY)
Admission: EM | Admit: 2012-11-10 | Discharge: 2012-11-10 | Disposition: A | Discharge status: Home / Self Care | Payer: Self-pay | Attending: Emergency Medicine | Admitting: Emergency Medicine

## 2012-11-10 ENCOUNTER — Encounter (HOSPITAL_COMMUNITY): Payer: Self-pay | Admitting: *Deleted

## 2012-11-10 DIAGNOSIS — Z91199 Patient's noncompliance with other medical treatment and regimen due to unspecified reason: Secondary | ICD-10-CM | POA: Insufficient documentation

## 2012-11-10 DIAGNOSIS — Z79899 Other long term (current) drug therapy: Secondary | ICD-10-CM | POA: Insufficient documentation

## 2012-11-10 DIAGNOSIS — I871 Compression of vein: Secondary | ICD-10-CM | POA: Insufficient documentation

## 2012-11-10 DIAGNOSIS — Z86711 Personal history of pulmonary embolism: Secondary | ICD-10-CM | POA: Insufficient documentation

## 2012-11-10 DIAGNOSIS — Z7901 Long term (current) use of anticoagulants: Secondary | ICD-10-CM | POA: Insufficient documentation

## 2012-11-10 DIAGNOSIS — Z880 Allergy status to penicillin: Secondary | ICD-10-CM | POA: Insufficient documentation

## 2012-11-10 DIAGNOSIS — M79609 Pain in unspecified limb: Secondary | ICD-10-CM | POA: Insufficient documentation

## 2012-11-10 DIAGNOSIS — Z9109 Other allergy status, other than to drugs and biological substances: Secondary | ICD-10-CM | POA: Insufficient documentation

## 2012-11-10 DIAGNOSIS — Z9119 Patient's noncompliance with other medical treatment and regimen: Secondary | ICD-10-CM | POA: Insufficient documentation

## 2012-11-10 DIAGNOSIS — I82401 Acute embolism and thrombosis of unspecified deep veins of right lower extremity: Secondary | ICD-10-CM

## 2012-11-10 DIAGNOSIS — D6859 Other primary thrombophilia: Secondary | ICD-10-CM | POA: Insufficient documentation

## 2012-11-10 DIAGNOSIS — I82409 Acute embolism and thrombosis of unspecified deep veins of unspecified lower extremity: Secondary | ICD-10-CM | POA: Insufficient documentation

## 2012-11-10 DIAGNOSIS — Z8639 Personal history of other endocrine, nutritional and metabolic disease: Secondary | ICD-10-CM | POA: Insufficient documentation

## 2012-11-10 MED ORDER — HYDROCODONE-ACETAMINOPHEN 5-325 MG PO TABS: 2.0000 | ORAL_TABLET | Freq: Four times a day (QID) | ORAL | Status: DC | PRN

## 2012-11-10 NOTE — Care Management Note (Addendum)
CARE MANAGEMENT NOTE 11/10/2012  Patient:  ASRA, GAMBREL   Account Number:  [id number]  Date Initiated:  11/10/2012  Documentation initiated by:  Vance Peper  Subjective/Objective Assessment:   42 yr old female in ED for right worsening leg pain. patient has +right leg DVT, confirmed by doppler .     Action/Plan:   CM spoke with patient concerning need for medication assistance and f/u MD appointments. Patient has concerns about employer allowing her to leave for appts. CM requested statement from MD for her.   Anticipated DC Date:  11/10/2012   Anticipated DC Plan:  HOME W HOSPICE CARE      DC Planning Services  MATCH Program  CM consult      Choice offered to / List presented to:             Status of service:  In process, will continue to follow Medicare Important Message given?   (If response is "NO", the following Medicare IM given date fields will be blank) Date Medicare IM given:   Date Additional Medicare IM given:    Discharge Disposition:  HOME/SELF CARE  Per UR Regulation:    If discussed at Long Length of Stay Meetings, dates discussed:    Comments:  11/10/12 1200.Marland KitchenMarland KitchenVance Peper, RN BSN NCM CM informed patient that [facility] to arrange an appointment for PT/INR monitoring and f/u. CM will contact patient with appointment date and time. Patient has been provided with [facility] letter for assistance with getting her lovenox and coumadin prescriptions filled today.

## 2012-11-10 NOTE — ED Provider Notes (Signed)
History     CSN: [id number]  Arrival date & time 11/10/12  1478   First MD Initiated Contact with Patient 11/10/12 [phone]      Chief Complaint  Patient presents with  . Leg Pain  . DVT    (Consider location/radiation/quality/duration/timing/severity/associated sxs/prior treatment) HPI This 42 year old female has a history of DVT and pulmonary was in the past, she was supposed to be on lifelong Coumadin but does not have a doctor and is noncompliant with Coumadin therapy, she has had several days of right leg pain and swelling like prior DVT with pain in the right popliteal region and right calf without fever without weakness without numbness without chest pain without shortness of breath syncope, she was seen in the ED yesterday with unremarkable labs and returned today as directed for ultrasound which confirmed DVT of the right leg, she needs medication assistance to fill her prescriptions that she received yesterday for Lovenox and Coumadin, she will call the Coumadin clinic for followup. She is moderate pain to the right popliteal region and right calf worse with palpation. Past Medical History  Diagnosis Date  . Protein S deficiency   . DVT (deep venous thrombosis)   . PE (pulmonary embolism)   . Environmental allergies     History reviewed. No pertinent past surgical history.  History reviewed. No pertinent family history.  History  Substance Use Topics  . Smoking status: Never Smoker   . Smokeless tobacco: Never Used  . Alcohol Use: No    OB History   Grav Para Term Preterm Abortions TAB SAB Ect Mult Living                  Review of Systems 10 Systems reviewed and are negative for acute change except as noted in the HPI. Allergies  Doxycycline; Histamine; Latex; Metronidazole; Penicillins; and Tetracyclines & related  Home Medications   Current Outpatient Rx  Name  Route  Sig  Dispense  Refill  . acetaminophen (TYLENOL) 650 MG CR tablet   Oral   Take 650 mg  by mouth every 8 (eight) hours as needed for pain.         Marland Kitchen enoxaparin (LOVENOX) 150 MG/ML injection   Subcutaneous   Inject 0.56 mLs (85 mg total) into the skin every 12 (twelve) hours.   14 Syringe   0   . HYDROcodone-acetaminophen (NORCO) 5-325 MG per tablet   Oral   Take 2 tablets by mouth every 6 (six) hours as needed for pain.   10 tablet   0   . hydrOXYzine (ATARAX/VISTARIL) 10 MG tablet   Oral   Take 10 mg by mouth 3 (three) times daily as needed for itching (allergies).          . warfarin (COUMADIN) 5 MG tablet   Oral   Take 1 tablet (5 mg total) by mouth daily.   60 tablet   0     BP 128/79  Pulse 80  Temp(Src) 98.7 F (37.1 C) (Oral)  Resp 16  SpO2 100%  LMP 11/04/2012  Physical Exam  Nursing note and vitals reviewed. Constitutional:  Awake, alert, nontoxic appearance.  HENT:  Head: Atraumatic.  Eyes: Right eye exhibits no discharge. Left eye exhibits no discharge.  Neck: Neck supple.  Cardiovascular: Normal rate and regular rhythm.   No murmur heard. Pulmonary/Chest: Effort normal and breath sounds normal. No respiratory distress. She has no wheezes. She has no rales. She exhibits no tenderness.  Abdominal: Soft. There  is no tenderness. There is no rebound.  Musculoskeletal: She exhibits edema and tenderness.  Baseline ROM, no obvious new focal weakness. Both legs nontender without edema. Right leg has mild edema to the lower leg with tenderness to the popliteal region and calf without cellulitis noted, the right foot his capillary refill less than 2 seconds normal light touch good range of motion intact strength and dorsalis pedis pulse intact  Neurological: She is alert.  Mental status and motor strength appears baseline for patient and situation.  Skin: No rash noted.  Psychiatric: She has a normal mood and affect.    ED Course  Procedures (including critical care time) CM consulted for med mgmt. 0915 Labs Reviewed - No data to display No  results found.   1. DVT of leg (deep venous thrombosis), right       MDM  Patient / Family / Caregiver informed of clinical course, understand medical decision-making process, and agree with plan.  I doubt any other EMC precluding discharge at this time including, but not necessarily limited to the following:hemodynamic instability.         Hurman Horn, MD 11/15/12 [phone]

## 2012-11-10 NOTE — ED Notes (Signed)
Pt was seen here yesterday for right lower leg pain, was sent here this morning for vascular study and it was + for right dvt.

## 2012-11-10 NOTE — ED Notes (Signed)
Spoke with case Chemical engineer about pt status. Case management asked that Dr Fonnie Jarvis write pt for a note for work stating that it is imperative that she has off work for the schedule days to get her blood levels checked at clinic M-F.

## 2012-11-10 NOTE — ED Notes (Signed)
Patient made aware case management coming to see patient about getting lovenox and coumadin.

## 2012-11-10 NOTE — ED Notes (Signed)
Case management at bedside speaking with pt

## 2012-11-10 NOTE — Progress Notes (Signed)
*  Preliminary Results* Right lower extremity venous duplex completed. The right lower extremity is positive for acute deep vein thrombosis involving the right popliteal, posterior tibial, and peroneal veins. There is no evidence of right Baker's cyst.  11/10/2012 8:40 AM Gertie Fey, RDMS, RDCS

## 2012-11-10 NOTE — ED Notes (Signed)
Pt alert and mentating appropriately upon d/c teaching and prescriptions. Pt verbalizes understanding of d.c teaching and prescriptions. Pt has no further questions upon d/c. NAD noted upon d/c pt requested wheelchair to be helped out. Pt leaving via wheelchair with d/c teaching, prescription, and work note.

## 2012-11-13 ENCOUNTER — Telehealth: Payer: Self-pay

## 2012-11-13 NOTE — Telephone Encounter (Signed)
Pt called requesting a sooner New Patient appt with Rene Kocher because her PT INR is due to be rechecked in 4 weeks but her appt is not until 05/29, please advise

## 2012-11-13 NOTE — Telephone Encounter (Signed)
Belinda Adams, I have talked with [place]. You can go ahead and let her establish with the coumadin clinic here before she sees me and she can just keep her new patient appoint on May 29 Belinda Adams

## 2012-11-14 NOTE — Telephone Encounter (Signed)
LMOM to call for an appt. °

## 2012-11-15 NOTE — Telephone Encounter (Signed)
LMOM to call for Coumadin appt.

## 2012-11-21 NOTE — Telephone Encounter (Signed)
Left another message to call for an appt with coumadin clinic.

## 2012-11-26 ENCOUNTER — Emergency Department (INDEPENDENT_AMBULATORY_CARE_PROVIDER_SITE_OTHER)
Admission: EM | Admit: 2012-11-26 | Discharge: 2012-11-26 | Disposition: A | Discharge status: Home / Self Care | Payer: Self-pay | Source: Home / Self Care

## 2012-11-26 ENCOUNTER — Telehealth (HOSPITAL_COMMUNITY): Payer: Self-pay

## 2012-11-26 ENCOUNTER — Encounter (HOSPITAL_COMMUNITY): Payer: Self-pay

## 2012-11-26 DIAGNOSIS — I82409 Acute embolism and thrombosis of unspecified deep veins of unspecified lower extremity: Secondary | ICD-10-CM

## 2012-11-26 DIAGNOSIS — I82401 Acute embolism and thrombosis of unspecified deep veins of right lower extremity: Secondary | ICD-10-CM

## 2012-11-26 NOTE — ED Notes (Signed)
Chart review.

## 2012-11-26 NOTE — ED Provider Notes (Signed)
History     CSN: [id number]  Arrival date & time 11/26/12  1217   First MD Initiated Contact with Patient 11/26/12 1327      Chief Complaint  Patient presents with  . Follow-up     HPI 42 year old female with history of protein S deficiency and she, history of DVT and PE who is supposed to be on lifelong anticoagulation who was recently seen in the ED for a right leg swelling and found to have a DVT of the leg. She was started on warfarin bridged with  Lovenox and discharged for followup in the clinic. Patient was on Coumadin previously and followed up with the health service clinic. She has been off Coumadin for a few months already. She denies any dizziness, fever, chills, headache, blurred vision, nausea, vomiting, chest pain, palpitations, shortness of breath, hemoptysis, hematemesis, abdominal pain, hematuria, bowel or urinary symptoms. She does have some pain in her right calf with swelling with prolonged sitting..  Past Medical History  Diagnosis Date  . Protein S deficiency   . DVT (deep venous thrombosis)   . PE (pulmonary embolism)   . Environmental allergies     History reviewed. No pertinent past surgical history.  No family history on file.  History  Substance Use Topics  . Smoking status: Never Smoker   . Smokeless tobacco: Never Used  . Alcohol Use: No    OB History   Grav Para Term Preterm Abortions TAB SAB Ect Mult Living                  Review of Systems As outlined in history of present illness Allergies  Doxycycline; Histamine; Latex; Metronidazole; Penicillins; and Tetracyclines & related  Home Medications   Current Outpatient Rx  Name  Route  Sig  Dispense  Refill  . acetaminophen (TYLENOL) 650 MG CR tablet   Oral   Take 650 mg by mouth every 8 (eight) hours as needed for pain.         Marland Kitchen enoxaparin (LOVENOX) 150 MG/ML injection   Subcutaneous   Inject 0.56 mLs (85 mg total) into the skin every 12 (twelve) hours.   14 Syringe   0    . HYDROcodone-acetaminophen (NORCO) 5-325 MG per tablet   Oral   Take 2 tablets by mouth every 6 (six) hours as needed for pain.   10 tablet   0   . hydrOXYzine (ATARAX/VISTARIL) 10 MG tablet   Oral   Take 10 mg by mouth 3 (three) times daily as needed for itching (allergies).          . warfarin (COUMADIN) 5 MG tablet   Oral   Take 1 tablet (5 mg total) by mouth daily.   60 tablet   0     BP 133/85  Pulse 85  Temp(Src) 97.9 F (36.6 C) (Oral)  Resp 16  SpO2 100%  LMP 11/04/2012  Physical Exam Middle aged female in no distress HEENT:, Nasal mucosa Chest: Clear to auscultation bilaterally, no added sounds CVS: Normal S1-S2, no murmurs rub or gallop Abdomen: Soft, nontender, nondistended, bowel sounds present Extremities: Warm, no edema CNS: AAOX3   ED Course  Procedures (including critical care time)  Labs Reviewed  PROTIME-INR - Abnormal; Notable for the following:    Prothrombin Time 24.9 (*)    INR 2.38 (*)    All other components within normal limits   No results found.   1. DVT (deep venous thrombosis), right    INR  of 2.58. Continue current dose of Coumadin (5 mg daily). Followup in the clinic in 2 weeks for INR monitoring and was adjustment. Counseled on returning to the ED, if has symptoms of leg swelling, SOB, chest pain, hemoptysis, fever, dizziness or syncope, hematemesis, rectal bleed or hematuria.     MDM  Followup in 2  weeks for INR check        Eddie North, MD 11/26/12 1708

## 2012-11-26 NOTE — ED Notes (Signed)
Hospital follow up-DVT

## 2012-11-26 NOTE — ED Notes (Signed)
Refill for coumadin called into [facility]  Patient is running low

## 2012-12-20 ENCOUNTER — Ambulatory Visit: Payer: BC Managed Care – PPO | Admitting: Internal Medicine

## 2012-12-25 ENCOUNTER — Ambulatory Visit: Payer: BC Managed Care – PPO

## 2013-01-03 ENCOUNTER — Ambulatory Visit: Payer: BC Managed Care – PPO

## 2013-01-30 ENCOUNTER — Ambulatory Visit: Payer: BC Managed Care – PPO

## 2013-02-19 ENCOUNTER — Ambulatory Visit: Payer: BC Managed Care – PPO | Attending: Family Medicine | Admitting: Internal Medicine

## 2013-02-19 VITALS — BP 133/85 | HR 70 | Temp 97.8°F | Resp 16 | Ht 66.0 in | Wt 200.0 lb

## 2013-02-19 DIAGNOSIS — R110 Nausea: Secondary | ICD-10-CM | POA: Insufficient documentation

## 2013-02-19 DIAGNOSIS — D6859 Other primary thrombophilia: Secondary | ICD-10-CM

## 2013-02-19 DIAGNOSIS — R5383 Other fatigue: Secondary | ICD-10-CM | POA: Insufficient documentation

## 2013-02-19 DIAGNOSIS — R5381 Other malaise: Secondary | ICD-10-CM | POA: Insufficient documentation

## 2013-02-19 DIAGNOSIS — IMO0001 Reserved for inherently not codable concepts without codable children: Secondary | ICD-10-CM

## 2013-02-19 DIAGNOSIS — I82509 Chronic embolism and thrombosis of unspecified deep veins of unspecified lower extremity: Secondary | ICD-10-CM

## 2013-02-19 LAB — COMPLETE METABOLIC PANEL WITH GFR
Alkaline Phosphatase: 54 U/L (ref 39–117)
BUN: 8 mg/dL (ref 6–23)
CO2: 25 mEq/L (ref 19–32)
GFR, Est African American: >89 mL/min
GFR, Est Non African American: >89 mL/min
Glucose, Bld: 80 mg/dL (ref 70–99)
Total Bilirubin: 0.5 mg/dL (ref 0.3–1.2)

## 2013-02-19 LAB — CBC
HCT: 37.4 % (ref 36.0–46.0)
MCH: 30 pg (ref 26.0–34.0)
MCHC: 34 g/dL (ref 30.0–36.0)
MCV: 88.2 fL (ref 78.0–100.0)
RDW: 15.2 % (ref 11.5–15.5)

## 2013-02-19 LAB — POCT INR: INR: 1.1

## 2013-02-19 LAB — LIPID PANEL
Cholesterol: 144 mg/dL (ref 0–200)
HDL: 49 mg/dL (ref >39)
Total CHOL/HDL Ratio: 2.9 Ratio

## 2013-02-19 MED ORDER — RIVAROXABAN 15 MG PO TABS: 15.0000 mg | ORAL_TABLET | Freq: Two times a day (BID) | ORAL | Status: DC

## 2013-02-19 NOTE — Progress Notes (Signed)
Patient states she has been out of coumadin for past month; states she has been having hot flashes with nausea for the past week as well.

## 2013-02-19 NOTE — Patient Instructions (Signed)
Follow promptly with her OB physician to get her IUD checked in for her Pap smear, if your intend to get pregnant please inform us as he will have to be taken off of xaralto.

## 2013-02-19 NOTE — Progress Notes (Signed)
Patient ID: Belinda Adams, female   DOB: 29-Nov-1970, 42 y.o.   MRN: [id number] Patient Demographics  Belinda Adams, is a 42 y.o. female  JXB:[id number]  ZHY:[id number]  DOB - 1971-02-02  Chief Complaint  Patient presents with  . Warfarin Sensitivity  . Nausea        Subjective:   [name] with History of protein S deficiency, IUD, recurrent right lower extremity DVTs, is been on Coumadin for several years but has been off of Coumadin for the last few weeks due to financial issues, comes here for routine followup visit in with complaints of fatigue, denies any chest pain, no palpitations, no cough no fever chills, no shortness of breath on rest or on exertion, no abdominal pain no dysuria. Also complains of mild nonspecific intermittent nausea.    Objective:    Patient Active Problem List   Diagnosis Date Noted  . Nausea alone 02/19/2013  . DVT, recurrent, lower extremity, chronic 02/19/2013  . Protein S deficiency 02/19/2013     Filed Vitals:   02/19/13 0936  BP: 133/85  Pulse: 70  Temp: 97.8 F (36.6 C)  TempSrc: Oral  Resp: 16  Height: 5\' 6"  (1.676 m)  Weight: 200 lb (90.719 kg)  SpO2: 98%     Exam   Awake Alert, Oriented X 3, No new F.N deficits, Normal affect .AT,PERRAL Supple Neck,No JVD, No cervical lymphadenopathy appriciated.  Symmetrical Chest wall movement, Good air movement bilaterally, CTAB RRR,No Gallops,Rubs or new Murmurs, No Parasternal Heave +ve B.Sounds, Abd Soft, Non tender, No organomegaly appriciated, No rebound - guarding or rigidity. No Cyanosis, Clubbing or edema, No new Rash or bruise      Data Review   CBC No results found for this basename: WBC, HGB, HCT, PLT, MCV, MCH, MCHC, RDW, NEUTRABS, LYMPHSABS, MONOABS, EOSABS, BASOSABS, BANDABS, BANDSABD,  in the last 168 hours  Chemistries   No results found for this basename: NA, K, CL, CO2, GLUCOSE, BUN, CREATININE, GFRCGP, CALCIUM, MG, AST, ALT, ALKPHOS, BILITOT,  in the  last 168 hours ------------------------------------------------------------------------------------------------------------------ No results found for this basename: HGBA1C,  in the last 72 hours ------------------------------------------------------------------------------------------------------------------ No results found for this basename: CHOL, HDL, LDLCALC, TRIG, CHOLHDL, LDLDIRECT,  in the last 72 hours ------------------------------------------------------------------------------------------------------------------ No results found for this basename: TSH, T4TOTAL, FREET3, T3FREE, THYROIDAB,  in the last 72 hours ------------------------------------------------------------------------------------------------------------------ No results found for this basename: VITAMINB12, FOLATE, FERRITIN, TIBC, IRON, RETICCTPCT,  in the last 72 hours  Coagulation profile  No results found for this basename: INR, PROTIME,  in the last 168 hours     Prior to Admission medications   Medication Sig Start Date End Date Taking? Authorizing Provider  hydrOXYzine (ATARAX/VISTARIL) 10 MG tablet Take 10 mg by mouth 3 (three) times daily as needed for itching (allergies).    Yes Historical Provider, MD  acetaminophen (TYLENOL) 650 MG CR tablet Take 650 mg by mouth every 8 (eight) hours as needed for pain.    Historical Provider, MD  enoxaparin (LOVENOX) 150 MG/ML injection Inject 0.56 mLs (85 mg total) into the skin every 12 (twelve) hours. 11/09/12   Gwyneth Sprout, MD  HYDROcodone-acetaminophen (NORCO) 5-325 MG per tablet Take 2 tablets by mouth every 6 (six) hours as needed for pain. 11/10/12   Hurman Horn, MD  Rivaroxaban (XARELTO) 15 MG TABS tablet Take 1 tablet (15 mg total) by mouth 2 (two) times daily with a meal. 02/19/13   Leroy Sea, MD  warfarin (COUMADIN) 5 MG tablet Take  1 tablet (5 mg total) by mouth daily. 11/09/12   Gwyneth Sprout, MD     Assessment & Plan   Fatigue and mild  nausea. Etiology unclear, will check screening labs which will include UA, urine pregnancy test, CBC, CMP, TSH, A1c, lipase, lipid panel, she will come back in 3 days for followup. Vital signs are stable and she is a nontoxic appearance with no specific subjective complaints besides fatigue.   History of protein S deficiency with recurrent right lower extremity DVTs. Out of Coumadin for a month. Have discussed with social worker locally, she will qualify for xaralto which will be initiated. Recent GFR was over 90 checked in April of this year. She has been counseled on compliance with anticoagulation.    History of IUD implant. Pap smear pending, referred to [facility] the same.     Routine health maintenance.  Screening labs. CBC, CMP, TSH, A1c, lipid panel      Pap smear - referral made            Leroy Sea M.D on 02/19/2013 at 9:51 AM

## 2013-02-20 LAB — URINALYSIS W MICROSCOPIC + REFLEX CULTURE
Bilirubin Urine: NEGATIVE
Casts: NONE SEEN
Glucose, UA: NEGATIVE mg/dL
Leukocytes, UA: NEGATIVE
Protein, ur: NEGATIVE mg/dL
pH: 6 (ref 5.0–8.0)

## 2013-02-20 NOTE — Progress Notes (Signed)
Quick Note:  get patient back in 1 month for a repeat visit and CBC, WBC count low. ______

## 2013-02-21 ENCOUNTER — Telehealth: Payer: Self-pay | Admitting: *Deleted

## 2013-02-21 ENCOUNTER — Ambulatory Visit: Payer: BC Managed Care – PPO | Attending: Family Medicine

## 2013-02-21 DIAGNOSIS — Z7901 Long term (current) use of anticoagulants: Secondary | ICD-10-CM

## 2013-02-21 NOTE — Telephone Encounter (Signed)
02/21/13 Patient made aware to come back in 1 month for a repeat visit and CBC. WBC count low. Patient stated she  Has an appointment for August. P.Cordia Miklos,RN BSN [facility]

## 2013-03-05 ENCOUNTER — Other Ambulatory Visit: Payer: BC Managed Care – PPO

## 2013-03-06 ENCOUNTER — Ambulatory Visit: Payer: Self-pay | Attending: Family Medicine

## 2013-03-06 DIAGNOSIS — Z7901 Long term (current) use of anticoagulants: Secondary | ICD-10-CM

## 2013-03-06 LAB — POCT INR: INR: 1

## 2013-04-10 ENCOUNTER — Encounter: Payer: Self-pay | Admitting: Obstetrics and Gynecology

## 2013-04-10 ENCOUNTER — Ambulatory Visit (INDEPENDENT_AMBULATORY_CARE_PROVIDER_SITE_OTHER): Payer: BC Managed Care – PPO | Admitting: Obstetrics and Gynecology

## 2013-04-10 VITALS — BP 136/91 | HR 74 | Ht 66.0 in | Wt 200.7 lb

## 2013-04-10 DIAGNOSIS — N898 Other specified noninflammatory disorders of vagina: Secondary | ICD-10-CM

## 2013-04-10 DIAGNOSIS — R030 Elevated blood-pressure reading, without diagnosis of hypertension: Secondary | ICD-10-CM

## 2013-04-10 DIAGNOSIS — IMO0001 Reserved for inherently not codable concepts without codable children: Secondary | ICD-10-CM

## 2013-04-10 NOTE — Progress Notes (Signed)
Patient has a mirena in place that should have been removed in May 2014. She doesn't have insurance so she couldn't have it removed at [facility].

## 2013-04-11 LAB — WET PREP, GENITAL
Trich, Wet Prep: NONE SEEN
WBC, Wet Prep HPF POC: NONE SEEN
Yeast Wet Prep HPF POC: NONE SEEN

## 2013-04-18 DIAGNOSIS — R030 Elevated blood-pressure reading, without diagnosis of hypertension: Secondary | ICD-10-CM | POA: Insufficient documentation

## 2013-04-18 NOTE — Progress Notes (Signed)
CC: Remove IUD    HPI Belinda Adams is a 42 y.o. No obstetric history on file. LMP 03/09/13.  She has had a "5 year" IUD in place x 5 years and would like it removed, however we are informed this could not be done today without payment.  She has an irriative and malodorous vaginal discharge.  Past Medical History  Diagnosis Date  . Protein S deficiency   . DVT (deep venous thrombosis)   . PE (pulmonary embolism)   . Environmental allergies     OB History  No data available    History reviewed. No pertinent past surgical history.  History   Social History  . Marital Status: Divorced    Spouse Name: N/A    Number of Children: N/A  . Years of Education: N/A   Occupational History  . Not on file.   Social History Main Topics  . Smoking status: Never Smoker   . Smokeless tobacco: Never Used  . Alcohol Use: No  . Drug Use: No  . Sexual Activity: Yes    Birth Control/ Protection: IUD   Other Topics Concern  . Not on file   Social History Narrative  . No narrative on file    Current Outpatient Prescriptions on File Prior to Visit  Medication Sig Dispense Refill  . acetaminophen (TYLENOL) 650 MG CR tablet Take 650 mg by mouth every 8 (eight) hours as needed for pain.      . hydrOXYzine (ATARAX/VISTARIL) 10 MG tablet Take 10 mg by mouth 3 (three) times daily as needed for itching (allergies).       . enoxaparin (LOVENOX) 150 MG/ML injection Inject 0.56 mLs (85 mg total) into the skin every 12 (twelve) hours.  14 Syringe  0  . HYDROcodone-acetaminophen (NORCO) 5-325 MG per tablet Take 2 tablets by mouth every 6 (six) hours as needed for pain.  10 tablet  0  . Rivaroxaban (XARELTO) 15 MG TABS tablet Take 1 tablet (15 mg total) by mouth 2 (two) times daily with a meal.  42 tablet  0   No current facility-administered medications on file prior to visit.    Allergies  Allergen Reactions  . Doxycycline   . Histamine   . Latex   . Metronidazole   . Penicillins   .  Tetracyclines & Related     ROS Pertinent items in HPI  PHYSICAL EXAM Filed Vitals:   04/10/13 1528  BP: 136/91  Pulse: 74   General: Well nourished, well developed female in no acute distress Cardiovascular: Normal rate Respiratory: Normal effort Abdomen: Soft, nontender Back: No CVAT Extremities: No edema Neurologic: Alert and oriented Speculum exam by Inetta Fermo, SNM: NEFG; vagina with grey discharge, no blood; cervix not well visualized Bimanual exam: cervix closed, no CMT; uterus NSSP; no adnexal tenderness or masses    ASSESSMENT  1. Vaginal discharge   2. Elevated BP   Pro S deficiency  Hx DVTs and PE  PLAN WP and Pap done [facility] is [facility]/U with Dr. Myna Hidalgo or other MD regarding her need for ongoing anticoagulation     Danae Orleans, CNM 04/18/2013 7:24 PM

## 2013-04-22 ENCOUNTER — Telehealth: Payer: Self-pay | Admitting: *Deleted

## 2013-04-22 NOTE — Telephone Encounter (Deleted)
Patient does not have a telephone number on file. Will

## 2013-04-22 NOTE — Telephone Encounter (Signed)
Message copied by Mannie Stabile on Mon Apr 22, 2013 10:30 AM ------      Message from: POE, DEIRDRE C      Created: Thu Apr 18, 2013  7:37 PM       Tried to contact pt but don't see a phone #. Please treat BV Flagyl 500 bid x7. Also there was no OB hx on the chart. ------

## 2013-04-24 NOTE — Telephone Encounter (Signed)
Patient allergic will route to provider to see if alternate medicine can be prescribed.

## 2013-05-10 ENCOUNTER — Ambulatory Visit: Payer: Self-pay

## 2013-08-03 ENCOUNTER — Encounter (HOSPITAL_COMMUNITY): Payer: Self-pay | Admitting: Emergency Medicine

## 2013-08-03 ENCOUNTER — Emergency Department (HOSPITAL_COMMUNITY)
Admission: EM | Admit: 2013-08-03 | Discharge: 2013-08-03 | Disposition: A | Discharge status: Home / Self Care | Payer: Self-pay | Attending: Emergency Medicine | Admitting: Emergency Medicine

## 2013-08-03 DIAGNOSIS — Z8639 Personal history of other endocrine, nutritional and metabolic disease: Secondary | ICD-10-CM | POA: Insufficient documentation

## 2013-08-03 DIAGNOSIS — R109 Unspecified abdominal pain: Secondary | ICD-10-CM

## 2013-08-03 DIAGNOSIS — Z91040 Latex allergy status: Secondary | ICD-10-CM | POA: Insufficient documentation

## 2013-08-03 DIAGNOSIS — B9689 Other specified bacterial agents as the cause of diseases classified elsewhere: Secondary | ICD-10-CM | POA: Insufficient documentation

## 2013-08-03 DIAGNOSIS — Z86718 Personal history of other venous thrombosis and embolism: Secondary | ICD-10-CM | POA: Insufficient documentation

## 2013-08-03 DIAGNOSIS — Z86711 Personal history of pulmonary embolism: Secondary | ICD-10-CM | POA: Insufficient documentation

## 2013-08-03 DIAGNOSIS — N760 Acute vaginitis: Secondary | ICD-10-CM | POA: Insufficient documentation

## 2013-08-03 DIAGNOSIS — N926 Irregular menstruation, unspecified: Secondary | ICD-10-CM | POA: Insufficient documentation

## 2013-08-03 DIAGNOSIS — Z862 Personal history of diseases of the blood and blood-forming organs and certain disorders involving the immune mechanism: Secondary | ICD-10-CM | POA: Insufficient documentation

## 2013-08-03 DIAGNOSIS — Z880 Allergy status to penicillin: Secondary | ICD-10-CM | POA: Insufficient documentation

## 2013-08-03 DIAGNOSIS — A499 Bacterial infection, unspecified: Secondary | ICD-10-CM | POA: Insufficient documentation

## 2013-08-03 DIAGNOSIS — Z3202 Encounter for pregnancy test, result negative: Secondary | ICD-10-CM | POA: Insufficient documentation

## 2013-08-03 DIAGNOSIS — R110 Nausea: Secondary | ICD-10-CM | POA: Insufficient documentation

## 2013-08-03 LAB — COMPREHENSIVE METABOLIC PANEL
ALBUMIN: 3.3 g/dL — AB (ref 3.5–5.2)
ALK PHOS: 56 U/L (ref 39–117)
ALT: 7 U/L (ref 0–35)
AST: 11 U/L (ref 0–37)
BUN: 9 mg/dL (ref 6–23)
CO2: 25 mEq/L (ref 19–32)
Calcium: 8.7 mg/dL (ref 8.4–10.5)
Chloride: 105 mEq/L (ref 96–112)
Creatinine, Ser: 0.71 mg/dL (ref 0.50–1.10)
GFR calc Af Amer: >90 mL/min (ref >90)
Glucose, Bld: 90 mg/dL (ref 70–99)
POTASSIUM: 3.7 meq/L (ref 3.7–5.3)
Sodium: 141 mEq/L (ref 137–147)
Total Bilirubin: 0.2 mg/dL — ABNORMAL LOW (ref 0.3–1.2)
Total Protein: 7.1 g/dL (ref 6.0–8.3)

## 2013-08-03 LAB — URINALYSIS, ROUTINE W REFLEX MICROSCOPIC
Bilirubin Urine: NEGATIVE
GLUCOSE, UA: NEGATIVE mg/dL
HGB URINE DIPSTICK: NEGATIVE
Ketones, ur: NEGATIVE mg/dL
Leukocytes, UA: NEGATIVE
Nitrite: NEGATIVE
Protein, ur: NEGATIVE mg/dL
SPECIFIC GRAVITY, URINE: 1.024 (ref 1.005–1.030)
Urobilinogen, UA: 1 mg/dL (ref 0.0–1.0)
pH: 6 (ref 5.0–8.0)

## 2013-08-03 LAB — CBC WITH DIFFERENTIAL/PLATELET
BASOS ABS: 0 10*3/uL (ref 0.0–0.1)
Basophils Relative: 0 % (ref 0–1)
EOS ABS: 0.1 10*3/uL (ref 0.0–0.7)
EOS PCT: 3 % (ref 0–5)
HCT: 38 % (ref 36.0–46.0)
Hemoglobin: 13 g/dL (ref 12.0–15.0)
LYMPHS ABS: 1.1 10*3/uL (ref 0.7–4.0)
Lymphocytes Relative: 38 % (ref 12–46)
MCH: 30.7 pg (ref 26.0–34.0)
MCHC: 34.2 g/dL (ref 30.0–36.0)
MCV: 89.8 fL (ref 78.0–100.0)
Monocytes Absolute: 0.3 10*3/uL (ref 0.1–1.0)
Monocytes Relative: 11 % (ref 3–12)
Neutro Abs: 1.4 10*3/uL — ABNORMAL LOW (ref 1.7–7.7)
Neutrophils Relative %: 48 % (ref 43–77)
PLATELETS: 247 10*3/uL (ref 150–400)
RBC: 4.23 MIL/uL (ref 3.87–5.11)
RDW: 13.2 % (ref 11.5–15.5)
WBC: 2.9 10*3/uL — ABNORMAL LOW (ref 4.0–10.5)

## 2013-08-03 LAB — WET PREP, GENITAL
Trich, Wet Prep: NONE SEEN
YEAST WET PREP: NONE SEEN

## 2013-08-03 LAB — LIPASE, BLOOD: Lipase: 19 U/L (ref 11–59)

## 2013-08-03 LAB — PREGNANCY, URINE: PREG TEST UR: NEGATIVE

## 2013-08-03 MED ORDER — METRONIDAZOLE 0.75 % VA GEL
1.0000 | Freq: Every day | VAGINAL | Status: DC
Start: 2013-08-03 — End: 2013-09-07

## 2013-08-03 MED ORDER — ONDANSETRON HCL 8 MG PO TABS: 8.0000 mg | ORAL_TABLET | Freq: Once | ORAL | Status: DC

## 2013-08-03 MED ORDER — ONDANSETRON HCL 4 MG PO TABS
8.0000 mg | ORAL_TABLET | Freq: Once | ORAL | Status: AC
  Administered 2013-08-03: 8 mg via ORAL
  Filled 2013-08-03: qty 2

## 2013-08-03 NOTE — ED Provider Notes (Signed)
Medical screening examination/treatment/procedure(s) were conducted as a shared visit with non-physician practitioner(s) and myself.  I personally evaluated the patient during the encounter.  EKG Interpretation   None        Pt stable, abd soft no focal tenderness,  Stable for d/c home and discussed need to continue her anticoagulants as outpatient  Joya Gaskinsonald W Tramaine Sauls, MD 08/03/13 [phone]

## 2013-08-03 NOTE — Discharge Instructions (Signed)
Please follow up with your primary care physician in 1-2 days. If you do not have one please call one from list below. Please follow up with your Ob/Gyn or call to find one at number above. Please take your antibiotic until completion. Please take Zofran as prescribed. Please restart your Xarelto. It is very important you take this to prevent another clot. Please read all discharge instructions and return precautions.    Abdominal Pain, Women Abdominal (stomach, pelvic, or belly) pain can be caused by many things. It is important to tell your doctor:  The location of the pain.  Does it come and go or is it present all the time?  Are there things that start the pain (eating certain foods, exercise)?  Are there other symptoms associated with the pain (fever, nausea, vomiting, diarrhea)? All of this is helpful to know when trying to find the cause of the pain. CAUSES   Stomach: virus or bacteria infection, or ulcer.  Intestine: appendicitis (inflamed appendix), regional ileitis (Crohn's disease), ulcerative colitis (inflamed colon), irritable bowel syndrome, diverticulitis (inflamed diverticulum of the colon), or cancer of the stomach or intestine.  Gallbladder disease or stones in the gallbladder.  Kidney disease, kidney stones, or infection.  Pancreas infection or cancer.  Fibromyalgia (pain disorder).  Diseases of the female organs:  Uterus: fibroid (non-cancerous) tumors or infection.  Fallopian tubes: infection or tubal pregnancy.  Ovary: cysts or tumors.  Pelvic adhesions (scar tissue).  Endometriosis (uterus lining tissue growing in the pelvis and on the pelvic organs).  Pelvic congestion syndrome (female organs filling up with blood just before the menstrual period).  Pain with the menstrual period.  Pain with ovulation (producing an egg).  Pain with an IUD (intrauterine device, birth control) in the uterus.  Cancer of the female organs.  Functional pain (pain  not caused by a disease, may improve without treatment).  Psychological pain.  Depression. DIAGNOSIS  Your doctor will decide the seriousness of your pain by doing an examination.  Blood tests.  X-rays.  Ultrasound.  CT scan (computed tomography, special type of X-ray).  MRI (magnetic resonance imaging).  Cultures, for infection.  Barium enema (dye inserted in the large intestine, to better view it with X-rays).  Colonoscopy (looking in intestine with a lighted tube).  Laparoscopy (minor surgery, looking in abdomen with a lighted tube).  Major abdominal exploratory surgery (looking in abdomen with a large incision). TREATMENT  The treatment will depend on the cause of the pain.   Many cases can be observed and treated at home.  Over-the-counter medicines recommended by your caregiver.  Prescription medicine.  Antibiotics, for infection.  Birth control pills, for painful periods or for ovulation pain.  Hormone treatment, for endometriosis.  Nerve blocking injections.  Physical therapy.  Antidepressants.  Counseling with a psychologist or psychiatrist.  Minor or major surgery. HOME CARE INSTRUCTIONS   Do not take laxatives, unless directed by your caregiver.  Take over-the-counter pain medicine only if ordered by your caregiver. Do not take aspirin because it can cause an upset stomach or bleeding.  Try a clear liquid diet (broth or water) as ordered by your caregiver. Slowly move to a bland diet, as tolerated, if the pain is related to the stomach or intestine.  Have a thermometer and take your temperature several times a day, and record it.  Bed rest and sleep, if it helps the pain.  Avoid sexual intercourse, if it causes pain.  Avoid stressful situations.  Keep your follow-up  appointments and tests, as your caregiver orders.  If the pain does not go away with medicine or surgery, you may try:  Acupuncture.  Relaxation exercises (yoga,  meditation).  Group therapy.  Counseling. SEEK MEDICAL CARE IF:   You notice certain foods cause stomach pain.  Your home care treatment is not helping your pain.  You need stronger pain medicine.  You want your IUD removed.  You feel faint or lightheaded.  You develop nausea and vomiting.  You develop a rash.  You are having side effects or an allergy to your medicine. SEEK IMMEDIATE MEDICAL CARE IF:   Your pain does not go away or gets worse.  You have a fever.  Your pain is felt only in portions of the abdomen. The right side could possibly be appendicitis. The left lower portion of the abdomen could be colitis or diverticulitis.  You are passing blood in your stools (bright red or black tarry stools, with or without vomiting).  You have blood in your urine.  You develop chills, with or without a fever.  You pass out. MAKE SURE YOU:   Understand these instructions.  Will watch your condition.  Will get help right away if you are not doing well or get worse. Document Released: 05/08/2007 Document Revised: 10/03/2011 Document Reviewed: 05/28/2009 [facility] Patient Information 2014 [place], [place]. Bacterial Vaginosis Bacterial vaginosis is a vaginal infection that occurs when the normal balance of bacteria in the vagina is disrupted. It results from an overgrowth of certain bacteria. This is the most common vaginal infection in women of childbearing age. Treatment is important to prevent complications, especially in pregnant women, as it can cause a premature delivery. CAUSES  Bacterial vaginosis is caused by an increase in harmful bacteria that are normally present in smaller amounts in the vagina. Several different kinds of bacteria can cause bacterial vaginosis. However, the reason that the condition develops is not fully understood. RISK FACTORS Certain activities or behaviors can put you at an increased risk of developing bacterial vaginosis,  including:  Having a new sex partner or multiple sex partners.  Douching.  Using an intrauterine device (IUD) for contraception. Women do not get bacterial vaginosis from toilet seats, bedding, swimming pools, or contact with objects around them. SIGNS AND SYMPTOMS  Some women with bacterial vaginosis have no signs or symptoms. Common symptoms include:  Grey vaginal discharge.  A fishlike odor with discharge, especially after sexual intercourse.  Itching or burning of the vagina and vulva.  Burning or pain with urination. DIAGNOSIS  Your health care provider will take a medical history and examine the vagina for signs of bacterial vaginosis. A sample of vaginal fluid may be taken. Your health care provider will look at this sample under a microscope to check for bacteria and abnormal cells. A vaginal pH test may also be done.  TREATMENT  Bacterial vaginosis may be treated with antibiotic medicines. These may be given in the form of a pill or a vaginal cream. A second round of antibiotics may be prescribed if the condition comes back after treatment.  HOME CARE INSTRUCTIONS   Only take over-the-counter or prescription medicines as directed by your health care provider.  If antibiotic medicine was prescribed, take it as directed. Make sure you finish it even if you start to feel better.  Do not have sex until treatment is completed.  Tell all sexual partners that you have a vaginal infection. They should see their health care provider and be treated  if they have problems, such as a mild rash or itching.  Practice safe sex by using condoms and only having one sex partner. SEEK MEDICAL CARE IF:   Your symptoms are not improving after 3 days of treatment.  You have increased discharge or pain.  You have a fever. MAKE SURE YOU:   Understand these instructions.  Will watch your condition.  Will get help right away if you are not doing well or get worse. FOR MORE INFORMATION   Centers for Disease Control and Prevention, Division of STD Prevention: SolutionApps[employer]za American Sexual Health Association (ASHA): www.ashastd.org  Document Released: 07/11/2005 Document Revised: 05/01/2013 Document Reviewed: 02/20/2013 [facility] Patient Information 2014 [place], [place].  Rivaroxaban oral tablets What is this medicine? RIVAROXABAN (ri va ROX a ban) is an anticoagulant (blood thinner). It is used to treat blood clots in the lungs or in the veins. It is also used after knee or hip surgeries to prevent blood clots. It is also used to lower the chance of stroke in people with a medical condition called atrial fibrillation. This medicine may be used for other purposes; ask your health care provider or pharmacist if you have questions. COMMON BRAND NAME(S): Xarelto What should I tell my health care provider before I take this medicine? They need to know if you have any of these conditions: -bleeding disorders -bleeding in the brain -blood in your stools (black or tarry stools) or if you have blood in your vomit -history of stomach bleeding -kidney disease -liver disease -low blood counts, like low white cell, platelet, or red cell counts -recent or planned spinal or epidural procedure -take medicines that treat or prevent blood clots -an unusual or allergic reaction to rivaroxaban, other medicines, foods, dyes, or preservatives -pregnant or trying to get pregnant -breast-feeding How should I use this medicine? Take this medicine by mouth with a glass of water. Follow the directions on the prescription label. Take your medicine at regular intervals. Do not take it more often than directed. Do not stop taking except on your doctor's advice. Stopping this medicine may increase your risk of a blot clot. Be sure to refill your prescription before you run out of medicine. If you are taking this medicine after hip or knee replacement surgery, take it with or without food. If you are  taking this medicine for atrial fibrillation, take it with your evening meal. If you are taking this medicine to treat blood clots, take it with food at the same time each day. If you are unable to swallow your tablet, you may crush the tablet and mix it in applesauce. Then, immediately eat the applesauce. You should eat more food right after you eat the applesauce containing the crushed tablet. Talk to your pediatrician regarding the use of this medicine in children. Special care may be needed. Overdosage: If you think you have taken too much of this medicine contact a poison control center or emergency room at once. NOTE: This medicine is only for you. Do not share this medicine with others. What if I miss a dose? If you take your medicine once a day and miss a dose, take the missed dose as soon as you remember. If you take your medicine twice a day and miss a dose, take the missed dose immediately. In this instance, 2 tablets may be taken at the same time. The next day you should take 1 tablet twice a day as directed. What may interact with this medicine? -aspirin and aspirin-like medicines -  certain antibiotics like erythromycin, azithromycin, and clarithromycin -certain medicines for fungal infections like ketoconazole and itraconazole -certain medicines for irregular heart beat like amiodarone, quinidine, dronedarone -certain medicines for seizures like carbamazepine, phenytoin -certain medicines that treat or prevent blood clots like warfarin, enoxaparin, and dalteparin  -conivaptan -diltiazem -felodipine -indinavir -lopinavir; ritonavir -NSAIDS, medicines for pain and inflammation, like ibuprofen or naproxen -ranolazine -rifampin -ritonavir -St. John's wort -verapamil This list may not describe all possible interactions. Give your health care provider a list of all the medicines, herbs, non-prescription drugs, or dietary supplements you use. Also tell them if you smoke, drink alcohol,  or use illegal drugs. Some items may interact with your medicine. What should I watch for while using this medicine? Visit your doctor or health care professional for regular checks on your progress. Your condition will be monitored carefully while you are receiving this medicine. Notify your doctor or health care professional and seek emergency treatment if you develop breathing problems; changes in vision; chest pain; severe, sudden headache; pain, swelling, warmth in the leg; trouble speaking; sudden numbness or weakness of the face, arm, or leg. These can be signs that your condition has gotten worse. If you are going to have surgery, tell your doctor or health care professional that you are taking this medicine. Tell your health care professional that you use this medicine before you have a spinal or epidural procedure. Sometimes people who take this medicine have bleeding problems around the spine when they have a spinal or epidural procedure. This bleeding is very rare. If you have a spinal or epidural procedure while on this medicine, call your health care professional immediately if you have back pain, numbness or tingling (especially in your legs and feet), muscle weakness, paralysis, or loss of bladder or bowel control. Avoid sports and activities that might cause injury while you are using this medicine. Severe falls or injuries can cause unseen bleeding. Be careful when using sharp tools or knives. Consider using an Neurosurgeon. Take special care brushing or flossing your teeth. Report any injuries, bruising, or red spots on the skin to your doctor or health care professional. What side effects may I notice from receiving this medicine? Side effects that you should report to your doctor or health care professional as soon as possible: -allergic reactions like skin rash, itching or hives, swelling of the face, lips, or tongue -back pain -redness, blistering, peeling or loosening of the  skin, including inside the mouth -signs and symptoms of bleeding such as bloody or black, tarry stools; red or dark-brown urine; spitting up blood or brown material that looks like coffee grounds; red spots on the skin; unusual bruising or bleeding from the eye, gums, or nose  Side effects that usually do not require medical attention (Report these to your doctor or health care professional if they continue or are bothersome.): -dizziness -muscle pain This list may not describe all possible side effects. Call your doctor for medical advice about side effects. You may report side effects to FDA at 1-800-FDA-1088. Where should I keep my medicine? Keep out of the reach of children. Store at room temperature between 15 and 30 degrees C (59 and 86 degrees F). Throw away any unused medicine after the expiration date. NOTE: This sheet is a summary. It may not cover all possible information. If you have questions about this medicine, talk to your doctor, pharmacist, or health care provider.  2014, [employer]vier/Gold Standard. (2012-12-26 09:51:31)

## 2013-08-03 NOTE — ED Provider Notes (Signed)
CSN: [id number]     Arrival date & time 08/03/13  [id number]   First MD Initiated Contact with Patient 08/03/13 [phone]     Chief Complaint  Patient presents with  . Abdominal Pain   (Consider location/radiation/quality/duration/timing/severity/associated sxs/prior Treatment) HPI Comments: Patient is a 733 P591 43 year old female presenting to the emergency department for one week of continued left sided pelvic and left-sided upper abdominal pain with associated nausea and vaginal discharge. Patient describes her pain as sharp in nature. No modifying factors. Patient states her last menstrual period was in November. She states her cycles are typically irregular. She doesn't worse recent protected sexual intercourse. Patient denies any fevers, vomiting, vaginal bleeding, urinary symptoms, diarrhea. No abdominal surgical history.   Past Medical History  Diagnosis Date  . Protein S deficiency   . DVT (deep venous thrombosis)   . PE (pulmonary embolism)   . Environmental allergies    History reviewed. No pertinent past surgical history. No family history on file. History  Substance Use Topics  . Smoking status: Never Smoker   . Smokeless tobacco: Never Used  . Alcohol Use: No   OB History   Grav Para Term Preterm Abortions TAB SAB Ect Mult Living                 Review of Systems  Constitutional: Negative for fever and chills.  Gastrointestinal: Positive for nausea and abdominal pain. Negative for vomiting and diarrhea.  Genitourinary: Positive for vaginal discharge and pelvic pain. Negative for dysuria and vaginal bleeding.  All other systems reviewed and are negative.    Allergies  Doxycycline; Latex; Metronidazole; Penicillins; and Tetracyclines & related  Home Medications   Current Outpatient Rx  Name  Route  Sig  Dispense  Refill  . hydrOXYzine (ATARAX/VISTARIL) 10 MG tablet   Oral   Take 10 mg by mouth 3 (three) times daily as needed for itching (allergies).           Marland Kitchen. levonorgestrel (MIRENA) 20 MCG/24HR IUD   Intrauterine   1 each by Intrauterine route once.         . metroNIDAZOLE (METROGEL) 0.75 % vaginal gel   Vaginal   Place 1 Applicatorful vaginally at bedtime. For five days   70 g   0   . ondansetron (ZOFRAN) 8 MG tablet   Oral   Take 1 tablet (8 mg total) by mouth once.   10 tablet   0    BP 131/77  Pulse 67  Temp(Src) 97.2 F (36.2 C) (Oral)  Resp 16  SpO2 100% Physical Exam  Constitutional: She is oriented to person, place, and time. She appears well-developed and well-nourished. No distress.  HENT:  Head: Normocephalic and atraumatic.  Right Ear: External ear normal.  Left Ear: External ear normal.  Nose: Nose normal.  Mouth/Throat: Oropharynx is clear and moist.  Eyes: Conjunctivae are normal.  Neck: Neck supple.  Cardiovascular: Normal rate, regular rhythm and normal heart sounds.   Pulmonary/Chest: Effort normal and breath sounds normal. No respiratory distress.  Abdominal: Soft. Bowel sounds are normal. She exhibits no distension. There is tenderness in the left upper quadrant and left lower quadrant. There is no rigidity, no rebound, no guarding and no CVA tenderness.  Musculoskeletal: She exhibits no edema and no tenderness.  Neurological: She is alert and oriented to person, place, and time.  Skin: Skin is warm and dry. She is not diaphoretic.   Exam performed by Francee PiccoloPIEPENBRINK, Lincy Belles L,  exam chaperoned  Date: 08/03/2013 Pelvic exam: normal external genitalia without evidence of trauma. VULVA: normal appearing vulva with no masses, tenderness or lesion. VAGINA: normal appearing vagina with normal color and discharge, no lesions. CERVIX: normal appearing cervix without lesions, cervical motion tenderness absent, cervical os closed with out purulent discharge; vaginal discharge - dark, malodorous and brown, Wet prep and DNA probe for chlamydia and GC obtained.   ADNEXA: normal adnexa in size, nontender and no  masses UTERUS: uterus is normal size, shape, consistency and nontender.    ED Course  Procedures (including critical care time) Medications  ondansetron (ZOFRAN) tablet 8 mg (8 mg Oral Given 08/03/13 0757)    Labs Review Labs Reviewed  WET PREP, GENITAL - Abnormal; Notable for the following:    Clue Cells Wet Prep HPF POC MANY (*)    WBC, Wet Prep HPF POC FEW (*)    All other components within normal limits  URINALYSIS, ROUTINE W REFLEX MICROSCOPIC - Abnormal; Notable for the following:    APPearance CLOUDY (*)    All other components within normal limits  CBC WITH DIFFERENTIAL - Abnormal; Notable for the following:    WBC 2.9 (*)    Neutro Abs 1.4 (*)    All other components within normal limits  COMPREHENSIVE METABOLIC PANEL - Abnormal; Notable for the following:    Albumin 3.3 (*)    Total Bilirubin 0.2 (*)    All other components within normal limits  GC/CHLAMYDIA PROBE AMP  PREGNANCY, URINE  LIPASE, BLOOD   Imaging Review No results found.  EKG Interpretation   None       MDM   1. Bacterial vaginosis   2. Abdominal  pain, other specified site     Afebrile, NAD, non-toxic appearing, AAOx4.   Patient is nontoxic, nonseptic appearing, in no apparent distress.  Patient's pain and other symptoms adequately managed in emergency department.  Fluid bolus given.  Labs, imaging and vitals reviewed.  Patient does not meet the SIRS or Sepsis criteria.  On repeat exam patient does not have a surgical abdomen and there are nor peritoneal signs.  Pelvic exam w/ brown discharge, no CMT or adnexal fullness or tenderness. No indication of appendicitis, bowel obstruction, bowel perforation, cholecystitis, diverticulitis, PID or ectopic pregnancy. Wet prep w/ many clue cells.  Patient discharged home with symptomatic treatment and given strict instructions for follow-up with their primary care physician.  I have also discussed reasons to return immediately to the ER.  Instructed  patient she needs to restart her Xarelto at home to prevent further clots. Patient expresses understanding and agrees with plan. Patient d/w with Dr. Bebe Shaggy, agrees with plan. Patient is stable at time of discharge          Jeannetta Ellis, PA-C 08/03/13 1014

## 2013-08-03 NOTE — ED Notes (Addendum)
Pt states she is having a peircing pain in her lower abd area.  Pt states pain started on week prior.  Pt adds that she feels like she is also having some pain in her upper abd starting today.  Pt states pain feels similar to muscle spasms

## 2013-08-05 LAB — GC/CHLAMYDIA PROBE AMP
CT Probe RNA: NEGATIVE
GC Probe RNA: NEGATIVE

## 2013-08-15 ENCOUNTER — Telehealth: Payer: Self-pay | Admitting: General Practice

## 2013-08-15 NOTE — Telephone Encounter (Signed)
Patient called back to front office and she stated that she has not had a period in 2 months and she normally has periods every month and also reports some cramping and nausea/vomiting. States she went to the ER last week for this and they did a pregnancy test but it was negative. Told patient to take another pregnancy test and that other things besides female organs can cause cramping and nausea and that since she is in her mid 1540s it could be that her body is getting ready for menopause which can cause her to miss periods or have irregular periods. Patient verbalized understanding and I Confirmed 3/6 appt and told patient to have money ready if she does not have insurance so the IUD can be removed. Patient stated she is in the process of getting insurance and if she gets insurance sooner then she will go to her other office. Patient had no further questions

## 2013-08-15 NOTE — Telephone Encounter (Signed)
Patient called and left message stating she has an IUD in and has not had a period in 2 months and thinks she is pregnant and the IUD is over 45 years old and she cannot get an appt till March 6, would like a call back. Called patient, no answer- left message that we are trying to return your phone call, please call us back at the clinics

## 2013-09-07 ENCOUNTER — Encounter (HOSPITAL_COMMUNITY): Payer: Self-pay | Admitting: Emergency Medicine

## 2013-09-07 ENCOUNTER — Emergency Department (HOSPITAL_COMMUNITY): Payer: Self-pay

## 2013-09-07 ENCOUNTER — Emergency Department (HOSPITAL_COMMUNITY)
Admission: EM | Admit: 2013-09-07 | Discharge: 2013-09-07 | Disposition: A | Discharge status: Home / Self Care | Payer: Self-pay | Attending: Emergency Medicine | Admitting: Emergency Medicine

## 2013-09-07 DIAGNOSIS — Z91040 Latex allergy status: Secondary | ICD-10-CM | POA: Insufficient documentation

## 2013-09-07 DIAGNOSIS — N83209 Unspecified ovarian cyst, unspecified side: Secondary | ICD-10-CM | POA: Insufficient documentation

## 2013-09-07 DIAGNOSIS — N809 Endometriosis, unspecified: Secondary | ICD-10-CM

## 2013-09-07 DIAGNOSIS — Z862 Personal history of diseases of the blood and blood-forming organs and certain disorders involving the immune mechanism: Secondary | ICD-10-CM | POA: Insufficient documentation

## 2013-09-07 DIAGNOSIS — Z880 Allergy status to penicillin: Secondary | ICD-10-CM | POA: Insufficient documentation

## 2013-09-07 DIAGNOSIS — N8003 Adenomyosis of the uterus: Secondary | ICD-10-CM

## 2013-09-07 DIAGNOSIS — Z86711 Personal history of pulmonary embolism: Secondary | ICD-10-CM | POA: Insufficient documentation

## 2013-09-07 DIAGNOSIS — Z86718 Personal history of other venous thrombosis and embolism: Secondary | ICD-10-CM | POA: Insufficient documentation

## 2013-09-07 DIAGNOSIS — N8000 Endometriosis of the uterus, unspecified: Secondary | ICD-10-CM | POA: Insufficient documentation

## 2013-09-07 DIAGNOSIS — N83201 Unspecified ovarian cyst, right side: Secondary | ICD-10-CM

## 2013-09-07 LAB — CBC WITH DIFFERENTIAL/PLATELET
Basophils Absolute: 0 K/uL (ref 0.0–0.1)
Basophils Relative: 1 % (ref 0–1)
Eosinophils Absolute: 0.1 K/uL (ref 0.0–0.7)
Eosinophils Relative: 1 % (ref 0–5)
HCT: 37 % (ref 36.0–46.0)
Hemoglobin: 12.6 g/dL (ref 12.0–15.0)
Lymphocytes Relative: 46 % (ref 12–46)
Lymphs Abs: 1.9 K/uL (ref 0.7–4.0)
MCH: 30.9 pg (ref 26.0–34.0)
MCHC: 34.1 g/dL (ref 30.0–36.0)
MCV: 90.7 fL (ref 78.0–100.0)
Monocytes Absolute: 0.4 K/uL (ref 0.1–1.0)
Monocytes Relative: 9 % (ref 3–12)
Neutro Abs: 1.8 K/uL (ref 1.7–7.7)
Neutrophils Relative %: 44 % (ref 43–77)
Platelets: 246 K/uL (ref 150–400)
RBC: 4.08 MIL/uL (ref 3.87–5.11)
RDW: 13.5 % (ref 11.5–15.5)
WBC: 4.2 K/uL (ref 4.0–10.5)

## 2013-09-07 LAB — COMPREHENSIVE METABOLIC PANEL WITH GFR
ALT: 7 U/L (ref 0–35)
AST: 14 U/L (ref 0–37)
Albumin: 3.2 g/dL — ABNORMAL LOW (ref 3.5–5.2)
Alkaline Phosphatase: 52 U/L (ref 39–117)
BUN: 9 mg/dL (ref 6–23)
CO2: 26 meq/L (ref 19–32)
Calcium: 9 mg/dL (ref 8.4–10.5)
Chloride: 104 meq/L (ref 96–112)
Creatinine, Ser: 0.7 mg/dL (ref 0.50–1.10)
GFR calc Af Amer: >90 mL/min
GFR calc non Af Amer: >90 mL/min
Glucose, Bld: 97 mg/dL (ref 70–99)
Potassium: 3.8 meq/L (ref 3.7–5.3)
Sodium: 140 meq/L (ref 137–147)
Total Bilirubin: 0.3 mg/dL (ref 0.3–1.2)
Total Protein: 6.8 g/dL (ref 6.0–8.3)

## 2013-09-07 LAB — HCG, QUANTITATIVE, PREGNANCY: hCG, Beta Chain, Quant, S: <1 m[IU]/mL

## 2013-09-07 MED ORDER — HYDROCODONE-ACETAMINOPHEN 5-325 MG PO TABS: 1.0000 | ORAL_TABLET | Freq: Four times a day (QID) | ORAL | Status: DC | PRN

## 2013-09-07 NOTE — ED Provider Notes (Signed)
CSN: [id number]     Arrival date & time 09/07/13  1205 History   First MD Initiated Contact with Patient 09/07/13 1311     Chief Complaint  Patient presents with  . Abdominal Pain     (Consider location/radiation/quality/duration/timing/severity/associated sxs/prior Treatment) Patient is a 43 y.o. female presenting with abdominal pain. The history is provided by the patient (the pt complains of lower abd pain starting today.  she had her iud removed thursday.  she is now having bleeding.  she has not had bleeding for 3 months).  Abdominal Pain Pain location:  LLQ Pain quality: aching   Pain radiates to:  Does not radiate Pain severity:  Mild Onset quality:  Sudden Timing:  Constant Chronicity:  New Associated symptoms: no chest pain, no cough, no diarrhea, no fatigue and no hematuria     Past Medical History  Diagnosis Date  . Protein S deficiency   . DVT (deep venous thrombosis)   . PE (pulmonary embolism)   . Environmental allergies    History reviewed. No pertinent past surgical history. History reviewed. No pertinent family history. History  Substance Use Topics  . Smoking status: Never Smoker   . Smokeless tobacco: Never Used  . Alcohol Use: No   OB History   Grav Para Term Preterm Abortions TAB SAB Ect Mult Living                 Review of Systems  Constitutional: Negative for appetite change and fatigue.  HENT: Negative for congestion, ear discharge and sinus pressure.   Eyes: Negative for discharge.  Respiratory: Negative for cough.   Cardiovascular: Negative for chest pain.  Gastrointestinal: Positive for abdominal pain. Negative for diarrhea.  Genitourinary: Negative for frequency and hematuria.       Vaginal bleeding  Musculoskeletal: Negative for back pain.  Skin: Negative for rash.  Neurological: Negative for seizures and headaches.  Psychiatric/Behavioral: Negative for hallucinations.      Allergies  Doxycycline; Histamine; Latex;  Metronidazole; Penicillins; and Tetracyclines & related  Home Medications   Current Outpatient Rx  Name  Route  Sig  Dispense  Refill  . hydrOXYzine (ATARAX/VISTARIL) 10 MG tablet   Oral   Take 10 mg by mouth 3 (three) times daily as needed for itching (allergies).          . terconazole (TERAZOL 3) 0.8 % vaginal cream   Vaginal   Place 1 applicator vaginally at bedtime. Started 2/13 for 3 day course          BP 141/83  Pulse 57  Temp(Src) 97.7 F (36.5 C) (Oral)  Resp 20  Ht 5\' 5"  (1.651 m)  Wt 193 lb (87.544 kg)  BMI 32.12 kg/m2  SpO2 100%  LMP 06/12/2013 Physical Exam  Constitutional: She is oriented to person, place, and time. She appears well-developed.  HENT:  Head: Normocephalic.  Eyes: Conjunctivae and EOM are normal. No scleral icterus.  Neck: Neck supple. No thyromegaly present.  Cardiovascular: Normal rate and regular rhythm.  Exam reveals no gallop and no friction rub.   No murmur heard. Pulmonary/Chest: No stridor. She has no wheezes. She has no rales. She exhibits no tenderness.  Abdominal: She exhibits no distension. There is no tenderness. There is no rebound.  Genitourinary:  Blood in vault and tender right adenexal  Musculoskeletal: Normal range of motion. She exhibits no edema.  Lymphadenopathy:    She has no cervical adenopathy.  Neurological: She is oriented to person, place, and time. She  exhibits normal muscle tone. Coordination normal.  Skin: No rash noted. No erythema.  Psychiatric: She has a normal mood and affect. Her behavior is normal.    ED Course  Procedures (including critical care time) Labs Review Labs Reviewed  COMPREHENSIVE METABOLIC PANEL - Abnormal; Notable for the following:    Albumin 3.2 (*)    All other components within normal limits  CBC WITH DIFFERENTIAL  HCG, QUANTITATIVE, PREGNANCY   Imaging Review Dg Abd Acute W/chest  09/07/2013   CLINICAL DATA:  Bilateral lower abdominal pain  EXAM: ACUTE ABDOMEN SERIES  (ABDOMEN 2 VIEW & CHEST 1 VIEW)  COMPARISON:  CT ABD W/CM dated 08/18/2007; DG CHEST 2 VIEW dated 10/23/2006  FINDINGS: Grossly unchanged cardiac silhouette and mediastinal contours. Grossly unchanged left basilar opacities favored to represent atelectasis. No new focal airspace opacities. No pleural effusion or pneumothorax. No evidence of edema.  Moderate colonic stool burden without evidence of obstruction. No pneumoperitoneum, pneumatosis or portal venous gas.  No abnormal intra-abdominal calcifications.  No acute osseus abnormalities.  IMPRESSION: 1. Bibasilar atelectasis without acute cardiopulmonary disease. 2. Moderate colonic stool burden without evidence of obstruction.   Electronically Signed   By: Simonne ComeJohn  Watts M.D.   On: 09/07/2013 15:00    EKG Interpretation   None       MDM   Final diagnoses:  None        Benny LennertJoseph L Jaylen Claude, MD 09/07/13 1547

## 2013-09-07 NOTE — Discharge Instructions (Signed)

## 2013-09-07 NOTE — ED Notes (Signed)
Pt return Rad,

## 2013-09-07 NOTE — ED Notes (Signed)
Patient transported to X-ray 

## 2013-09-07 NOTE — ED Notes (Signed)
Pt denies being on her period, states she went to the bathroom and wiped and there was blood. MD notified.

## 2013-09-07 NOTE — ED Provider Notes (Signed)
Right adnexal pain on exam and recent IUD removal with some bleeding.  Pelvic u/s pending.    5:27 PM Quant is <1 and pt not pregnant.  U/S showed possible adenomyosis and right ovarian cyst.  Ovarian cyst may be the cause of pt's right adnexal pain and will have f/u with OB/gyn for adenomyosis evaluation.  Gwyneth SproutWhitney Browning [place], MD 09/07/13 [phone]

## 2013-09-07 NOTE — ED Notes (Signed)
IUD removed Thursday.

## 2013-09-07 NOTE — ED Notes (Addendum)
Pt in c/o generalized abd pain with distention, states she has bilateral lower suprapubic pain and also upper abdominal pain, c/o nausea, states she was seen for same over a month ago and symptoms have continued.

## 2013-09-27 ENCOUNTER — Ambulatory Visit: Payer: Self-pay | Admitting: Medical

## 2013-10-28 ENCOUNTER — Encounter: Payer: Self-pay | Admitting: Gastroenterology

## 2013-12-18 ENCOUNTER — Ambulatory Visit: Payer: Self-pay | Admitting: Gastroenterology

## 2014-10-03 ENCOUNTER — Other Ambulatory Visit: Payer: Self-pay | Admitting: Obstetrics and Gynecology

## 2014-10-04 ENCOUNTER — Encounter (HOSPITAL_COMMUNITY): Payer: Self-pay | Admitting: Emergency Medicine

## 2014-10-04 ENCOUNTER — Emergency Department (HOSPITAL_COMMUNITY)
Admission: EM | Admit: 2014-10-04 | Discharge: 2014-10-04 | Disposition: A | Discharge status: Home / Self Care | Payer: Self-pay | Attending: Emergency Medicine | Admitting: Emergency Medicine

## 2014-10-04 DIAGNOSIS — Z3202 Encounter for pregnancy test, result negative: Secondary | ICD-10-CM | POA: Insufficient documentation

## 2014-10-04 DIAGNOSIS — Z86711 Personal history of pulmonary embolism: Secondary | ICD-10-CM | POA: Insufficient documentation

## 2014-10-04 DIAGNOSIS — Z79899 Other long term (current) drug therapy: Secondary | ICD-10-CM | POA: Insufficient documentation

## 2014-10-04 DIAGNOSIS — E860 Dehydration: Secondary | ICD-10-CM | POA: Insufficient documentation

## 2014-10-04 DIAGNOSIS — Z862 Personal history of diseases of the blood and blood-forming organs and certain disorders involving the immune mechanism: Secondary | ICD-10-CM | POA: Insufficient documentation

## 2014-10-04 DIAGNOSIS — Z86718 Personal history of other venous thrombosis and embolism: Secondary | ICD-10-CM | POA: Insufficient documentation

## 2014-10-04 DIAGNOSIS — R197 Diarrhea, unspecified: Secondary | ICD-10-CM | POA: Insufficient documentation

## 2014-10-04 DIAGNOSIS — Z91040 Latex allergy status: Secondary | ICD-10-CM | POA: Insufficient documentation

## 2014-10-04 DIAGNOSIS — Z8709 Personal history of other diseases of the respiratory system: Secondary | ICD-10-CM | POA: Insufficient documentation

## 2014-10-04 DIAGNOSIS — Z7901 Long term (current) use of anticoagulants: Secondary | ICD-10-CM | POA: Insufficient documentation

## 2014-10-04 DIAGNOSIS — R112 Nausea with vomiting, unspecified: Secondary | ICD-10-CM | POA: Insufficient documentation

## 2014-10-04 DIAGNOSIS — Z880 Allergy status to penicillin: Secondary | ICD-10-CM | POA: Insufficient documentation

## 2014-10-04 LAB — URINE MICROSCOPIC-ADD ON

## 2014-10-04 LAB — COMPREHENSIVE METABOLIC PANEL
ALK PHOS: 62 U/L (ref 39–117)
ALT: 14 U/L (ref 0–35)
AST: 23 U/L (ref 0–37)
Albumin: 3.3 g/dL — ABNORMAL LOW (ref 3.5–5.2)
Anion gap: 5 (ref 5–15)
BILIRUBIN TOTAL: 0.3 mg/dL (ref 0.3–1.2)
BUN: 8 mg/dL (ref 6–23)
CHLORIDE: 104 mmol/L (ref 96–112)
CO2: 31 mmol/L (ref 19–32)
CREATININE: 0.85 mg/dL (ref 0.50–1.10)
Calcium: 8.9 mg/dL (ref 8.4–10.5)
GFR calc Af Amer: >90 mL/min (ref >90)
GFR calc non Af Amer: 82 mL/min — ABNORMAL LOW (ref >90)
Glucose, Bld: 109 mg/dL — ABNORMAL HIGH (ref 70–99)
Potassium: 3.5 mmol/L (ref 3.5–5.1)
Sodium: 140 mmol/L (ref 135–145)
TOTAL PROTEIN: 6.5 g/dL (ref 6.0–8.3)

## 2014-10-04 LAB — CBC WITH DIFFERENTIAL/PLATELET
BASOS ABS: 0 10*3/uL (ref 0.0–0.1)
BASOS PCT: 0 % (ref 0–1)
Eosinophils Absolute: 0 10*3/uL (ref 0.0–0.7)
Eosinophils Relative: 0 % (ref 0–5)
HCT: 37.8 % (ref 36.0–46.0)
HEMOGLOBIN: 12.5 g/dL (ref 12.0–15.0)
LYMPHS PCT: 12 % (ref 12–46)
Lymphs Abs: 0.6 10*3/uL — ABNORMAL LOW (ref 0.7–4.0)
MCH: 29.4 pg (ref 26.0–34.0)
MCHC: 33.1 g/dL (ref 30.0–36.0)
MCV: 88.9 fL (ref 78.0–100.0)
MONO ABS: 0.3 10*3/uL (ref 0.1–1.0)
Monocytes Relative: 5 % (ref 3–12)
NEUTROS ABS: 4.1 10*3/uL (ref 1.7–7.7)
Neutrophils Relative %: 83 % — ABNORMAL HIGH (ref 43–77)
Platelets: 252 10*3/uL (ref 150–400)
RBC: 4.25 MIL/uL (ref 3.87–5.11)
RDW: 14.2 % (ref 11.5–15.5)
WBC: 5 10*3/uL (ref 4.0–10.5)

## 2014-10-04 LAB — LIPASE, BLOOD: Lipase: 21 U/L (ref 11–59)

## 2014-10-04 LAB — URINALYSIS, ROUTINE W REFLEX MICROSCOPIC
BILIRUBIN URINE: NEGATIVE
GLUCOSE, UA: NEGATIVE mg/dL
HGB URINE DIPSTICK: NEGATIVE
KETONES UR: NEGATIVE mg/dL
NITRITE: NEGATIVE
PH: 6 (ref 5.0–8.0)
PROTEIN: NEGATIVE mg/dL
SPECIFIC GRAVITY, URINE: 1.013 (ref 1.005–1.030)
UROBILINOGEN UA: 0.2 mg/dL (ref 0.0–1.0)

## 2014-10-04 LAB — POC URINE PREG, ED: PREG TEST UR: NEGATIVE

## 2014-10-04 LAB — PROTIME-INR
INR: 1.94 — ABNORMAL HIGH (ref 0.00–1.49)
Prothrombin Time: 22.3 seconds — ABNORMAL HIGH (ref 11.6–15.2)

## 2014-10-04 MED ORDER — SODIUM CHLORIDE 0.9 % IV BOLUS (SEPSIS)
1000.0000 mL | Freq: Once | INTRAVENOUS | Status: AC
  Administered 2014-10-04: 1000 mL via INTRAVENOUS

## 2014-10-04 MED ORDER — ONDANSETRON HCL 4 MG/2ML IJ SOLN
4.0000 mg | Freq: Once | INTRAMUSCULAR | Status: AC
  Administered 2014-10-04: 4 mg via INTRAVENOUS
  Filled 2014-10-04: qty 2

## 2014-10-04 MED ORDER — KETOROLAC TROMETHAMINE 30 MG/ML IJ SOLN
30.0000 mg | Freq: Once | INTRAMUSCULAR | Status: AC
  Administered 2014-10-04: 30 mg via INTRAVENOUS
  Filled 2014-10-04: qty 1

## 2014-10-04 MED ORDER — METOCLOPRAMIDE HCL 10 MG PO TABS: 10.0000 mg | ORAL_TABLET | Freq: Four times a day (QID) | ORAL | Status: DC | PRN

## 2014-10-04 NOTE — ED Provider Notes (Signed)
CSN: [id number]     Arrival date & time 10/04/14  0348 History   First MD Initiated Contact with Patient 10/04/14 0455     Chief Complaint  Patient presents with  . Diarrhea  . Emesis     (Consider location/radiation/quality/duration/timing/severity/associated sxs/prior Treatment) The history is provided by the patient.  Belinda Adams is a 44 y.o. female hx of protein C deficiency, PE on coumadin here with abdominal pain, chills, vomiting. She was fine when she went to bed. She woke up this morning with some diarrhea as well as an episode of vomiting. Some subjective chills but no fever. Denies any sick contacts. Denies any urinary symptoms.    Past Medical History  Diagnosis Date  . Protein S deficiency   . DVT (deep venous thrombosis)   . PE (pulmonary embolism)   . Environmental allergies    History reviewed. No pertinent past surgical history. No family history on file. History  Substance Use Topics  . Smoking status: Never Smoker   . Smokeless tobacco: Never Used  . Alcohol Use: No   OB History    No data available     Review of Systems  Gastrointestinal: Positive for vomiting and diarrhea.  All other systems reviewed and are negative.     Allergies  Doxycycline; Histamine; Latex; Metronidazole; Penicillins; and Tetracyclines & related  Home Medications   Prior to Admission medications   Medication Sig Start Date End Date Taking? Authorizing Provider  fluconazole (DIFLUCAN) 150 MG tablet Take 150 mg by mouth every 3 (three) days.   Yes Historical Provider, MD  lansoprazole (PREVACID) 30 MG capsule Take 30 mg by mouth daily. Take 30 minutes before breakfast. 11/19/13  Yes Historical Provider, MD  warfarin (COUMADIN) 1 MG tablet Take 1 mg by mouth one time only at 6 PM. Take one tablet nightly along with one  tablet or as directed 08/27/14  Yes Historical Provider, MD  warfarin (COUMADIN) 5 MG tablet Take 5 mg by mouth one time only at 6 PM. Take with 1 mg  tablet 08/27/14  Yes Historical Provider, MD  HYDROcodone-acetaminophen (NORCO/VICODIN) 5-325 MG per tablet Take 1 tablet by mouth every 6 (six) hours as needed for severe pain. Patient not taking: Reported on 10/04/2014 09/07/13   Gwyneth Sprout, MD  hydrOXYzine (ATARAX/VISTARIL) 10 MG tablet Take 10 mg by mouth 3 (three) times daily as needed for itching (allergies).     Historical Provider, MD  terconazole (TERAZOL 3) 0.8 % vaginal cream Place 1 applicator vaginally at bedtime. Started 2/13 for 3 day course    Historical Provider, MD   BP 114/67 mmHg  Pulse 97  Temp(Src) 98.5 F (36.9 C) (Oral)  Resp 18  Ht  (1.676 m)  Wt 202 lb (91.627 kg)  BMI 32.62 kg/m2  SpO2 96%  LMP 07/28/2014 Physical Exam  Constitutional: She is oriented to person, place, and time.  Slightly dehydrated   HENT:  Head: Normocephalic.  MM slightly dry   Eyes: Conjunctivae are normal. Pupils are equal, round, and reactive to light.  Neck: Normal range of motion. Neck supple.  Cardiovascular: Normal rate, regular rhythm and normal heart sounds.   Pulmonary/Chest: Effort normal and breath sounds normal. No respiratory distress. She has no wheezes. She has no rales.  Abdominal: Soft. Bowel sounds are normal. She exhibits no distension. There is no tenderness. There is no rebound.  Musculoskeletal: Normal range of motion. She exhibits no edema or tenderness.  Neurological: She is alert and  oriented to person, place, and time. No cranial nerve deficit. Coordination normal.  Skin: Skin is warm and dry.  Psychiatric: She has a normal mood and affect. Her behavior is normal.  Nursing note and vitals reviewed.   ED Course  Procedures (including critical care time) Labs Review Labs Reviewed  CBC WITH DIFFERENTIAL/PLATELET - Abnormal; Notable for the following:    Neutrophils Relative % 83 (*)    Lymphs Abs 0.6 (*)    All other components within normal limits  COMPREHENSIVE METABOLIC PANEL - Abnormal; Notable  for the following:    Glucose, Bld 109 (*)    Albumin 3.3 (*)    GFR calc non Af Amer 82 (*)    All other components within normal limits  URINALYSIS, ROUTINE W REFLEX MICROSCOPIC - Abnormal; Notable for the following:    Leukocytes, UA TRACE (*)    All other components within normal limits  PROTIME-INR - Abnormal; Notable for the following:    Prothrombin Time 22.3 (*)    INR 1.94 (*)    All other components within normal limits  LIPASE, BLOOD  URINE MICROSCOPIC-ADD ON  POC URINE PREG, ED    Imaging Review No results found.   EKG Interpretation None      MDM   Final diagnoses:  None    Belinda Adams is a 44 y.o. female here with vomiting, diarrhea. Likely gastro. Abdomen nontender. Will hydrate and give zofran and reassess.   6:49 AM Labs and UA unremarkable. Vitals stable. Felt better with IVF. Will dc home. Likely viral gastro.    Richardean Canalavid H Yao, MD 10/04/14 [phone]

## 2014-10-04 NOTE — ED Notes (Signed)
Dr. Yao at bedside. 

## 2014-10-04 NOTE — Discharge Instructions (Signed)
Stay hydrated.   Take reglan as needed for nausea and vomiting.   Take tylenol, motrin for pain or fever.   Follow up with your doctor.   Return to ER if you have vomiting, dehydration.

## 2014-10-04 NOTE — ED Notes (Signed)
Pt states she just woke up with diarrhea, chills, nausea, and vomiting.  Denies pain.

## 2015-02-16 ENCOUNTER — Other Ambulatory Visit: Payer: Self-pay | Admitting: Obstetrics and Gynecology

## 2015-02-16 DIAGNOSIS — R928 Other abnormal and inconclusive findings on diagnostic imaging of breast: Secondary | ICD-10-CM

## 2015-11-27 ENCOUNTER — Other Ambulatory Visit: Payer: Self-pay | Admitting: Obstetrics and Gynecology

## 2016-02-15 ENCOUNTER — Other Ambulatory Visit: Payer: Self-pay | Admitting: Obstetrics and Gynecology

## 2016-02-16 LAB — CYTOLOGY - PAP

## 2016-03-22 ENCOUNTER — Encounter (HOSPITAL_COMMUNITY): Payer: Self-pay

## 2016-03-22 ENCOUNTER — Emergency Department (HOSPITAL_COMMUNITY)
Admission: EM | Admit: 2016-03-22 | Discharge: 2016-03-23 | Disposition: A | Discharge status: Home / Self Care | Payer: 59 | Attending: Emergency Medicine | Admitting: Emergency Medicine

## 2016-03-22 DIAGNOSIS — Z91040 Latex allergy status: Secondary | ICD-10-CM | POA: Diagnosis not present

## 2016-03-22 DIAGNOSIS — B9689 Other specified bacterial agents as the cause of diseases classified elsewhere: Secondary | ICD-10-CM | POA: Diagnosis not present

## 2016-03-22 DIAGNOSIS — R102 Pelvic and perineal pain: Secondary | ICD-10-CM | POA: Insufficient documentation

## 2016-03-22 DIAGNOSIS — N760 Acute vaginitis: Secondary | ICD-10-CM | POA: Insufficient documentation

## 2016-03-22 DIAGNOSIS — Z7901 Long term (current) use of anticoagulants: Secondary | ICD-10-CM | POA: Insufficient documentation

## 2016-03-22 DIAGNOSIS — Z79899 Other long term (current) drug therapy: Secondary | ICD-10-CM | POA: Insufficient documentation

## 2016-03-22 LAB — URINALYSIS, ROUTINE W REFLEX MICROSCOPIC
Bilirubin Urine: NEGATIVE
Glucose, UA: NEGATIVE mg/dL
Hgb urine dipstick: NEGATIVE
Ketones, ur: NEGATIVE mg/dL
Leukocytes, UA: NEGATIVE
Nitrite: NEGATIVE
Protein, ur: NEGATIVE mg/dL
Specific Gravity, Urine: 1.026 (ref 1.005–1.030)
pH: 6.5 (ref 5.0–8.0)

## 2016-03-22 LAB — POC URINE PREG, ED: Preg Test, Ur: NEGATIVE

## 2016-03-22 MED ORDER — LORAZEPAM 1 MG PO TABS
0.5000 mg | ORAL_TABLET | Freq: Once | ORAL | Status: AC
  Administered 2016-03-22: 0.5 mg via ORAL
  Filled 2016-03-22: qty 1

## 2016-03-22 MED ORDER — OXYCODONE-ACETAMINOPHEN 5-325 MG PO TABS
1.0000 | ORAL_TABLET | Freq: Once | ORAL | Status: AC
Start: 2016-03-22 — End: 2016-03-22
  Administered 2016-03-22: 1 via ORAL
  Filled 2016-03-22: qty 1

## 2016-03-22 MED ORDER — IBUPROFEN 200 MG PO TABS
400.0000 mg | ORAL_TABLET | Freq: Once | ORAL | Status: DC
Start: 2016-03-22 — End: 2016-03-23
  Filled 2016-03-22: qty 2

## 2016-03-22 NOTE — ED Provider Notes (Signed)
[facility]-EMERGENCY DEPT Provider Note   CSN: [id number] Arrival date & time: 03/22/16  1732  By signing my name below, I, Rosario AdieWilliam Andrew Hiatt, attest that this documentation has been prepared under the direction and in the presence of Raeford RazorStephen Wally Shevchenko, MD. Electronically Signed: Rosario AdieWilliam Andrew Hiatt, ED Scribe. 03/22/16. 10:59 PM.  History   Chief Complaint Chief Complaint  Patient presents with  . Pelvic Pain   The history is provided by the patient. No language interpreter was used.   HPI Comments: Belinda Adams is a 45 y.o. female who presents to the Emergency Department complaining of gradual onset, gradually worsening, constant, sharp mid-suprapubic abdominal pain onset this AM. Pt reports associated urinary frequency, nausea, emesis x 1 episodes, and suprapubic pressure. Pt further notes associated vaginal itching prior to the onset of her pain ~1 day ago. Pt states that she woke up with her pain this morning, and noticed that she began her menstrual period. Her LNMP was ~5 months ago, and she is typically irregular at baseline. She f/u w/ her OBGYN for this problem and notes that they performed an US that was unremarkable at that time. Her pain was exacerbated with movement. She tried taking 2 doses of Tylenol, 1/2 of a dose of Hydrocodone, and applied heat with minimal relief of her pain. She also used a vaginal creme during the onset of her itching with complete relief. She states that her pain was moderately relieved with a urine void while in the waiting room of the ED. No PSHx to the abdomen. Pt notes that she has a hx of similar pain ~20 years ago when she had a bladder infection. Her recent stool movements have been increased. She denies urinary retentions, discharge, fevers, decreased urine, chills, dysuria, hematuria, constipation, diarrhea, or bloody stools. She is currently sexually active, with concern for STDs.   Past Medical History:  Diagnosis Date  . DVT (deep venous  thrombosis) (HCC)   . Environmental allergies   . PE (pulmonary embolism)   . Protein S deficiency [facility](HCC)    Patient Active Problem List   Diagnosis Date Noted  . Elevated BP 04/18/2013  . DVT, recurrent, lower extremity, chronic (HCC) 02/19/2013  . Protein S deficiency (HCC) 02/19/2013   History reviewed. No pertinent surgical history.  OB History    No data available     Home Medications    Prior to Admission medications   Medication Sig Start Date End Date Taking? Authorizing Provider  fluconazole (DIFLUCAN) 150 MG tablet Take 150 mg by mouth every 3 (three) days.    Historical Provider, MD  HYDROcodone-acetaminophen (NORCO/VICODIN) 5-325 MG per tablet Take 1 tablet by mouth every 6 (six) hours as needed for severe pain. Patient not taking: Reported on 10/04/2014 09/07/13   Gwyneth SproutWhitney Plunkett, MD  hydrOXYzine (ATARAX/VISTARIL) 10 MG tablet Take 10 mg by mouth 3 (three) times daily as needed for itching (allergies).     Historical Provider, MD  lansoprazole (PREVACID) 30 MG capsule Take 30 mg by mouth daily. Take 30 minutes before breakfast. 11/19/13   Historical Provider, MD  metoCLOPramide (REGLAN) 10 MG tablet Take 1 tablet (10 mg total) by mouth every 6 (six) hours as needed for nausea (nausea/headache). 10/04/14   Charlynne Panderavid Hsienta Yao, MD  terconazole (TERAZOL 3) 0.8 % vaginal cream Place 1 applicator vaginally at bedtime. Started 2/13 for 3 day course    Historical Provider, MD  warfarin (COUMADIN) 1 MG tablet Take 1 mg by mouth one time only at 6  PM. Take one tablet nightly along with one 5mg  tablet or as directed 08/27/14   Historical Provider, MD  warfarin (COUMADIN) 5 MG tablet Take 5 mg by mouth one time only at 6 PM. Take with 1 mg tablet 08/27/14   Historical Provider, MD   Family History No family history on file.  Social History Social History  Substance Use Topics  . Smoking status: Never Smoker  . Smokeless tobacco: Never Used  . Alcohol use No   Allergies     Doxycycline; Histamine; Latex; Metronidazole; Penicillins; and Tetracyclines & related  Review of Systems Review of Systems  Constitutional: Negative for chills.  Gastrointestinal: Positive for abdominal pain, nausea and vomiting. Negative for blood in stool, constipation and diarrhea.  Genitourinary: Positive for frequency and vaginal bleeding. Negative for decreased urine volume, difficulty urinating, vaginal discharge and vaginal pain.  All other systems reviewed and are negative.  Physical Exam Updated Vital Signs BP 128/85   Pulse 65   Temp 98.2 F (36.8 C) (Oral)   Resp 18   LMP 03/22/2016   SpO2 99%   Physical Exam  Constitutional: She appears well-developed and well-nourished. No distress.  HENT:  Head: Normocephalic and atraumatic.  Mouth/Throat: Oropharynx is clear and moist. No oropharyngeal exudate.  Eyes: Conjunctivae and EOM are normal. Pupils are equal, round, and reactive to light. Right eye exhibits no discharge. Left eye exhibits no discharge. No scleral icterus.  Neck: Normal range of motion. Neck supple. No JVD present. No thyromegaly present.  Cardiovascular: Normal rate, regular rhythm, normal heart sounds and intact distal pulses.  Exam reveals no gallop and no friction rub.   No murmur heard. Pulmonary/Chest: Effort normal and breath sounds normal. No respiratory distress. She has no wheezes. She has no rales.  Abdominal: Soft. Bowel sounds are normal. She exhibits no distension and no mass. There is tenderness (Suprapubic).  Musculoskeletal: Normal range of motion. She exhibits no edema or tenderness.  Lymphadenopathy:    She has no cervical adenopathy.  Neurological: She is alert. Coordination normal.  Skin: Skin is warm and dry. No rash noted. No erythema.  Psychiatric: She has a normal mood and affect. Her behavior is normal.  Nursing note and vitals reviewed.  ED Treatments / Results  DIAGNOSTIC STUDIES: Oxygen Saturation is 99% on RA, normal by  my interpretation.   COORDINATION OF CARE: 10:59 PM-Discussed next steps with pt. Pt verbalized understanding and is agreeable with the plan.   Labs (all labs ordered are listed, but only abnormal results are displayed) Labs Reviewed  WET PREP, GENITAL - Abnormal; Notable for the following:       Result Value   Clue Cells Wet Prep HPF POC PRESENT (*)    WBC, Wet Prep HPF POC FEW (*)    All other components within normal limits  URINALYSIS, ROUTINE W REFLEX MICROSCOPIC (NOT AT [facility])  POC URINE PREG, ED  GC/CHLAMYDIA PROBE AMP ([place]) NOT AT [facility]   EKG  EKG Interpretation None      Radiology No results found.  Procedures Procedures (including critical care time)  Medications Ordered in ED Medications - No data to display  Initial Impression / Assessment and Plan / ED Course  I have reviewed the triage vital signs and the nursing notes.  Pertinent labs & imaging results that were available during my care of the patient were reviewed by me and considered in my medical decision making (see chart for details).  Clinical Course  45yF with pelvic  pain/pressure. UA clean. Pelvic unremarkable aside from some blood. She declined empiric GC tx. Wet prep pending. I feel she is appropriate for discharge. Return precautions were discussed.   Final Clinical Impressions(s) / ED Diagnoses   Final diagnoses:  Pelvic pain in female  Bacterial vaginosis    New Prescriptions New Prescriptions   No medications on file    I personally preformed the services scribed in my presence. The recorded information has been reviewed is accurate. Raeford Razor, MD.     Raeford Razor, MD 04/02/16 [phone]

## 2016-03-22 NOTE — ED Notes (Signed)
Patient states that she feels too bad to have labs drawn at triage.

## 2016-03-22 NOTE — ED Triage Notes (Addendum)
Patient complains of pelvic pain that started this am with vaginal bleeding. States she also had period 1st of month. States nausea and some diarrhea with same, no dysuria. Took tylenol and hydrocodone today with minimal relief. States had normal pelvic u/s last month

## 2016-03-23 LAB — GC/CHLAMYDIA PROBE AMP (~~LOC~~) NOT AT ARMC
Chlamydia: NEGATIVE
Neisseria Gonorrhea: NEGATIVE

## 2016-03-23 LAB — WET PREP, GENITAL
Sperm: NONE SEEN
Trich, Wet Prep: NONE SEEN
Yeast Wet Prep HPF POC: NONE SEEN

## 2016-03-23 MED ORDER — CLINDAMYCIN PHOSPHATE (1 DOSE) 2 % VA CREA
TOPICAL_CREAM | VAGINAL | 0 refills | Status: DC
Start: 2016-03-23 — End: 2016-10-13

## 2016-03-23 NOTE — Discharge Instructions (Addendum)
Follow up with your gynecologist

## 2016-03-23 NOTE — ED Notes (Signed)
Pt provided with d/c instructions at this time. Pt verbalizes understanding of d/c instructions as well as follow up procedure after d/c. Pt provided with RX for clindamycin cream.  Pt verbalizes understanding of RX directions at this time.  Pt in no apparent distress at this time.  Pt ambulatory at time of d/c.

## 2016-03-23 NOTE — ED Provider Notes (Signed)
Patient signed out pending wet prep. Wet prep with clue cells. She has an allergy to metronidazole. Will discharge him with clindamycin cream vaginally for BV. Discharge instructions otherwise by Dr. Juleen ChinaKohut.  Geraldine Solar.cd   Courtney F Horton, MD 03/23/16 [phone]

## 2016-05-20 ENCOUNTER — Ambulatory Visit: Payer: 59 | Admitting: Nurse Practitioner

## 2016-09-08 ENCOUNTER — Ambulatory Visit (INDEPENDENT_AMBULATORY_CARE_PROVIDER_SITE_OTHER): Payer: BLUE CROSS/BLUE SHIELD | Admitting: Nurse Practitioner

## 2016-09-08 ENCOUNTER — Other Ambulatory Visit (INDEPENDENT_AMBULATORY_CARE_PROVIDER_SITE_OTHER): Payer: BLUE CROSS/BLUE SHIELD

## 2016-09-08 ENCOUNTER — Encounter: Payer: Self-pay | Admitting: Nurse Practitioner

## 2016-09-08 VITALS — BP 134/90 | HR 76 | Temp 98.5°F | Ht 66.0 in | Wt 202.0 lb

## 2016-09-08 DIAGNOSIS — J309 Allergic rhinitis, unspecified: Secondary | ICD-10-CM

## 2016-09-08 DIAGNOSIS — I82501 Chronic embolism and thrombosis of unspecified deep veins of right lower extremity: Secondary | ICD-10-CM | POA: Diagnosis not present

## 2016-09-08 DIAGNOSIS — Z7901 Long term (current) use of anticoagulants: Secondary | ICD-10-CM

## 2016-09-08 DIAGNOSIS — IMO0001 Reserved for inherently not codable concepts without codable children: Secondary | ICD-10-CM

## 2016-09-08 DIAGNOSIS — D6859 Other primary thrombophilia: Secondary | ICD-10-CM

## 2016-09-08 LAB — CBC WITH DIFFERENTIAL/PLATELET
BASOS PCT: 0.6 % (ref 0.0–3.0)
Basophils Absolute: 0 10*3/uL (ref 0.0–0.1)
EOS PCT: 1.5 % (ref 0.0–5.0)
Eosinophils Absolute: 0.1 10*3/uL (ref 0.0–0.7)
HEMATOCRIT: 39.5 % (ref 36.0–46.0)
Hemoglobin: 13.1 g/dL (ref 12.0–15.0)
LYMPHS PCT: 44 % (ref 12.0–46.0)
Lymphs Abs: 2.1 10*3/uL (ref 0.7–4.0)
MCHC: 33.1 g/dL (ref 30.0–36.0)
MCV: 89.1 fl (ref 78.0–100.0)
MONOS PCT: 10.2 % (ref 3.0–12.0)
Monocytes Absolute: 0.5 10*3/uL (ref 0.1–1.0)
Neutro Abs: 2.1 10*3/uL (ref 1.4–7.7)
Neutrophils Relative %: 43.7 % (ref 43.0–77.0)
Platelets: 299 10*3/uL (ref 150.0–400.0)
RBC: 4.44 Mil/uL (ref 3.87–5.11)
RDW: 15 % (ref 11.5–15.5)
WBC: 4.8 10*3/uL (ref 4.0–10.5)

## 2016-09-08 LAB — HEPATIC FUNCTION PANEL
ALT: 14 U/L (ref 0–35)
AST: 15 U/L (ref 0–37)
Albumin: 4.2 g/dL (ref 3.5–5.2)
Alkaline Phosphatase: 72 U/L (ref 39–117)
BILIRUBIN TOTAL: 0.4 mg/dL (ref 0.2–1.2)
Bilirubin, Direct: 0.1 mg/dL (ref 0.0–0.3)
TOTAL PROTEIN: 7.8 g/dL (ref 6.0–8.3)

## 2016-09-08 LAB — APTT: APTT: 28.7 s (ref 23.4–32.7)

## 2016-09-08 LAB — PROTIME-INR
INR: 1.4 ratio — ABNORMAL HIGH (ref 0.8–1.0)
Prothrombin Time: 15.1 s — ABNORMAL HIGH (ref 9.6–13.1)

## 2016-09-08 LAB — BASIC METABOLIC PANEL
BUN: 12 mg/dL (ref 6–23)
CHLORIDE: 106 meq/L (ref 96–112)
CO2: 30 meq/L (ref 19–32)
Calcium: 10 mg/dL (ref 8.4–10.5)
Creatinine, Ser: 0.75 mg/dL (ref 0.40–1.20)
GFR: 106.27 mL/min (ref >60.00)
GLUCOSE: 90 mg/dL (ref 70–99)
POTASSIUM: 3.7 meq/L (ref 3.5–5.1)
SODIUM: 139 meq/L (ref 135–145)

## 2016-09-08 MED ORDER — FLUTICASONE PROPIONATE 50 MCG/ACT NA SUSP: 2.0000 | Freq: Every day | NASAL | 11 refills | Status: DC

## 2016-09-08 MED ORDER — RIVAROXABAN 20 MG PO TABS: 20.0000 mg | ORAL_TABLET | Freq: Every day | ORAL | 3 refills | Status: DC

## 2016-09-08 MED ORDER — HYDROXYZINE HCL 10 MG PO TABS: 10.0000 mg | ORAL_TABLET | Freq: Three times a day (TID) | ORAL | 11 refills | Status: DC | PRN

## 2016-09-08 MED ORDER — PANTOPRAZOLE SODIUM 40 MG PO TBEC: 40.0000 mg | DELAYED_RELEASE_TABLET | Freq: Every day | ORAL | 3 refills | Status: DC

## 2016-09-08 NOTE — Progress Notes (Signed)
Pre visit review using our clinic review tool, if applicable. No additional management support is needed unless otherwise documented below in the visit note. 

## 2016-09-08 NOTE — Progress Notes (Signed)
Subjective:  Patient ID: Belinda Adams, female    DOB: 12/24/1970  Age: 46 y.o. MRN: [id number]  CC: Establish Care (est care/med consult)   Sinus Problem  This is a chronic problem. The current episode started more than 1 year ago. The problem has been waxing and waning since onset. There has been no fever. Associated symptoms include congestion, coughing and sneezing. Pertinent negatives include no chills, diaphoresis, ear pain, headaches, hoarse voice, neck pain, shortness of breath, sinus pressure, sore throat or swollen glands. Treatments tried: flonase and vistaril. The treatment provided significant relief.  needs flonase and vistaril prescription. Vistaril prescribed by [place] allergist.  Chronic DVT and protein S deficiency: Has not been taking warfarin due to work schedule and inability to take time off for INR check. She will like anticoagulant changed. Hx of recurrent DVT in bilateral Le. Denies any SOB or CP or edema or claudication.  Outpatient Medications Prior to Visit  Medication Sig Dispense Refill  . Clindamycin Phosphate, 1 Dose, vaginal cream Apply vaginally BID For 7 days 5.8 g 0  . hydrOXYzine (ATARAX/VISTARIL) 10 MG tablet Take 10 mg by mouth 3 (three) times daily as needed for itching (allergies).     . pantoprazole (PROTONIX) 40 MG tablet Take 40 mg by mouth daily.    Marland Kitchen warfarin (COUMADIN) 1 MG tablet Take 1 mg by mouth one time only at 6 PM. Take one tablet nightly along with one 5mg  tablet or as directed    . warfarin (COUMADIN) 5 MG tablet Take 5 mg by mouth one time only at 6 PM. Take with 1 mg tablet    . HYDROcodone-acetaminophen (NORCO/VICODIN) 5-325 MG per tablet Take 1 tablet by mouth every 6 (six) hours as needed for severe pain. (Patient not taking: Reported on 09/08/2016) 10 tablet 0  . metoCLOPramide (REGLAN) 10 MG tablet Take 1 tablet (10 mg total) by mouth every 6 (six) hours as needed for nausea (nausea/headache). (Patient not taking: Reported on  09/08/2016) 10 tablet 0   No facility-administered medications prior to visit.     ROS See HPI  Objective:  BP 134/90   Pulse 76   Temp 98.5 F (36.9 C)   Ht 5\' 6"  (1.676 m)   Wt 202 lb (91.6 kg)   SpO2 99%   BMI 32.60 kg/m   BP Readings from Last 3 Encounters:  09/08/16 134/90  03/23/16 134/83  10/04/14 116/65    Wt Readings from Last 3 Encounters:  09/08/16 202 lb (91.6 kg)  10/04/14 202 lb (91.6 kg)  09/07/13 193 lb (87.5 kg)    Physical Exam  Constitutional: She is oriented to person, place, and time. No distress.  HENT:  Right Ear: External ear normal.  Left Ear: External ear normal.  Nose: Mucosal edema and rhinorrhea present. Right sinus exhibits no maxillary sinus tenderness and no frontal sinus tenderness. Left sinus exhibits no maxillary sinus tenderness and no frontal sinus tenderness.  Mouth/Throat: Posterior oropharyngeal erythema present. No oropharyngeal exudate.  Eyes: No scleral icterus.  Neck: Normal range of motion. Neck supple.  Cardiovascular: Normal rate, regular rhythm and normal heart sounds.   Pulmonary/Chest: Effort normal and breath sounds normal. No respiratory distress.  Abdominal: Soft. She exhibits no distension.  Musculoskeletal: Normal range of motion. She exhibits no edema.  Lymphadenopathy:    She has no cervical adenopathy.  Neurological: She is alert and oriented to person, place, and time.  Skin: Skin is warm and dry.  Psychiatric: She has  a normal mood and affect. Her behavior is normal.    Lab Results  Component Value Date   WBC 4.8 09/08/2016   HGB 13.1 09/08/2016   HCT 39.5 09/08/2016   PLT 299.0 09/08/2016   GLUCOSE 90 09/08/2016   CHOL 144 02/19/2013   TRIG 40 02/19/2013   HDL 49 02/19/2013   LDLCALC 87 02/19/2013   ALT 14 09/08/2016   AST 15 09/08/2016   NA 139 09/08/2016   K 3.7 09/08/2016   CL 106 09/08/2016   CREATININE 0.75 09/08/2016   BUN 12 09/08/2016   CO2 30 09/08/2016   TSH 0.974 02/19/2013    INR 1.4 (H) 09/08/2016   HGBA1C 5.4 02/19/2013    No results found.  Assessment & Plan:   Bernadene BellDebora was seen today for establish care.  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 -     pantoprazole (PROTONIX) 40 MG tablet; Take 1 tablet (40 mg total) by mouth daily.  Protein S deficiency (HCC) -     Basic metabolic panel; Future -     INR/PT; Future -     PTT; Future -     CBC w/Diff; Future -     Hepatic function panel; Future -     rivaroxaban (XARELTO) 20 MG TABS tablet; Take 1 tablet (20 mg total) by mouth daily with supper. -     pantoprazole (PROTONIX) 40 MG tablet; Take 1 tablet (40 mg total) by mouth daily.  Chronic allergic rhinitis, unspecified seasonality, unspecified trigger -     hydrOXYzine (ATARAX/VISTARIL) 10 MG tablet; Take 1 tablet (10 mg total) by mouth 3 (three) times daily as needed for itching (allergies). -     fluticasone (FLONASE) 50 MCG/ACT nasal spray; Place 2 sprays into both nostrils daily.  Current use of long term anticoagulation -     INR/PT; Future -     PTT; Future -     CBC w/Diff; Future -     Hepatic function panel; Future -     rivaroxaban (XARELTO) 20 MG TABS tablet; Take 1 tablet (20 mg total) by mouth daily with supper. -     pantoprazole (PROTONIX) 40 MG tablet; Take 1 tablet (40 mg total) by mouth daily.   I have discontinued Ms. Jerrel IvoryBraden's HYDROcodone-acetaminophen, warfarin, warfarin, and metoCLOPramide. I have also changed her hydrOXYzine, fluticasone, and pantoprazole. Additionally, I am having her start on rivaroxaban. Lastly, I am having her maintain her Clindamycin Phosphate (1 Dose).  Meds ordered this encounter    Medications  . DISCONTD: fluticasone (FLONASE) 50 MCG/ACT nasal spray    Sig: Place into the nose.  . hydrOXYzine (ATARAX/VISTARIL) 10 MG tablet    Sig: Take 1 tablet (10 mg total) by mouth 3 (three) times daily as needed for itching (allergies).    Dispense:  30 tablet    Refill:  11    Order Specific Question:   Supervising Provider    Answer:   Tresa GarterPLOTNIKOV, ALEKSEI V [[id number]]  . fluticasone (FLONASE) 50 MCG/ACT nasal spray    Sig: Place 2 sprays  into both nostrils daily.    Dispense:  16 g    Refill:  11    Order Specific Question:   Supervising Provider    Answer:   Tresa Garter [[id number]]  . rivaroxaban (XARELTO) 20 MG TABS tablet    Sig: Take 1 tablet (20 mg total) by mouth daily with supper.    Dispense:  30 tablet    Refill:  3    Order Specific Question:   Supervising Provider    Answer:   Tresa Garter [[id number]]  . pantoprazole (PROTONIX) 40 MG tablet    Sig: Take 1 tablet (40 mg total) by mouth daily.    Dispense:  30 tablet    Refill:  3    Order Specific Question:   Supervising Provider    Answer:   Tresa Garter [[id number]]    Follow-up: Return in about 4 weeks (around 10/06/2016) for anticoagulation re eval.  Alysia Penna, NP

## 2016-09-08 NOTE — Patient Instructions (Signed)
Belinda Adams sent prescription for xarelto and after review of lab results  Rivaroxaban oral tablets What is this medicine? RIVAROXABAN (ri va ROX a ban) is an anticoagulant (blood thinner). It is used to treat blood clots in the lungs or in the veins. It is also used after knee or hip surgeries to prevent blood clots. It is also used to lower the chance of stroke in people with a medical condition called atrial fibrillation. COMMON BRAND NAME(S): Xarelto, Xarelto Starter Pack What should I tell my health care provider before I take this medicine? They need to know if you have any of these conditions: -bleeding disorders -bleeding in the brain -blood in your stools (black or tarry stools) or if you have blood in your vomit -history of stomach bleeding -kidney disease -liver disease -low blood counts, like low white cell, platelet, or red cell counts -recent or planned spinal or epidural procedure -take medicines that treat or prevent blood clots -an unusual or allergic reaction to rivaroxaban, other medicines, foods, dyes, or preservatives -pregnant or trying to get pregnant -breast-feeding How should I use this medicine? Take this medicine by mouth with a glass of water. Follow the directions on the prescription label. Take your medicine at regular intervals. Do not take it more often than directed. Do not stop taking except on your doctor's advice. Stopping this medicine may increase your risk of a blood clot. Be sure to refill your prescription before you run out of medicine. If you are taking this medicine after hip or knee replacement surgery, take it with or without food. If you are taking this medicine for atrial fibrillation, take it with your evening meal. If you are taking this medicine to treat blood clots, take it with food at the same time each day. If you are unable to swallow your tablet, you may crush the tablet and mix it in applesauce. Then, immediately eat the applesauce. You should  eat more food right after you eat the applesauce containing the crushed tablet. Talk to your pediatrician regarding the use of this medicine in children. Special care may be needed. What if I miss a dose? If you take your medicine once a day and miss a dose, take the missed dose as soon as you remember. If you take your medicine twice a day and miss a dose, take the missed dose immediately. In this instance, 2 tablets may be taken at the same time. The next day you should take 1 tablet twice a day as directed. What may interact with this medicine? Do not take this medicine with any of the following medications: -defibrotideThis medicine may also interact with the following medications: -aspirin and aspirin-like medicines -certain antibiotics like erythromycin, azithromycin, and clarithromycin -certain medicines for fungal infections like ketoconazole and itraconazole -certain medicines for irregular heart beat like amiodarone, quinidine, dronedarone -certain medicines for seizures like carbamazepine, phenytoin -certain medicines that treat or prevent blood clots like warfarin, enoxaparin, and dalteparin -conivaptan -diltiazem -felodipine -indinavir -lopinavir; ritonavir -NSAIDS, medicines for pain and inflammation, like ibuprofen or naproxen -ranolazine -rifampin -ritonavir -SNRIs, medicines for depression, like desvenlafaxine, duloxetine, levomilnacipran, venlafaxine -SSRIs, medicines for depression, like citalopram, escitalopram, fluoxetine, fluvoxamine, paroxetine, sertraline -St. John's wort -verapamil What should I watch for while using this medicine? Visit your doctor or health care professional for regular checks on your progress. Your condition Belinda Adams be monitored carefully while you are receiving this medicine. Notify your doctor or health care professional and seek emergency treatment if you develop breathing problems;  changes in vision; chest pain; severe, sudden headache; pain,  swelling, warmth in the leg; trouble speaking; sudden numbness or weakness of the face, arm, or leg. These can be signs that your condition has gotten worse. If you are going to have surgery, tell your doctor or health care professional that you are taking this medicine. Tell your health care professional that you use this medicine before you have a spinal or epidural procedure. Sometimes people who take this medicine have bleeding problems around the spine when they have a spinal or epidural procedure. This bleeding is very rare. If you have a spinal or epidural procedure while on this medicine, call your health care professional immediately if you have back pain, numbness or tingling (especially in your legs and feet), muscle weakness, paralysis, or loss of bladder or bowel control. Avoid sports and activities that might cause injury while you are using this medicine. Severe falls or injuries can cause unseen bleeding. Be careful when using sharp tools or knives. Consider using an Neurosurgeon. Take special care brushing or flossing your teeth. Report any injuries, bruising, or red spots on the skin to your doctor or health care professional. What side effects may I notice from receiving this medicine? Side effects that you should report to your doctor or health care professional as soon as possible: -allergic reactions like skin rash, itching or hives, swelling of the face, lips, or tongue -back pain -redness, blistering, peeling or loosening of the skin, including inside the mouth -signs and symptoms of bleeding such as bloody or black, tarry stools; red or dark-brown urine; spitting up blood or brown material that looks like coffee grounds; red spots on the skin; unusual bruising or bleeding from the eye, gums, or nose Side effects that usually do not require medical attention (report to your doctor or health care professional if they continue or are bothersome): -dizziness -muscle pain Where  should I keep my medicine? Keep out of the reach of children. Store at room temperature between 15 and 30 degrees C (59 and 86 degrees F). Throw away any unused medicine after the expiration date.  2017 Elsevier/Gold Standard (2015-08-13 11:19:11)

## 2016-10-02 ENCOUNTER — Encounter (HOSPITAL_COMMUNITY): Payer: Self-pay | Admitting: *Deleted

## 2016-10-02 ENCOUNTER — Ambulatory Visit (HOSPITAL_COMMUNITY)
Admission: EM | Admit: 2016-10-02 | Discharge: 2016-10-02 | Disposition: A | Discharge status: Home / Self Care | Payer: BLUE CROSS/BLUE SHIELD | Attending: Family Medicine | Admitting: Family Medicine

## 2016-10-02 DIAGNOSIS — S39012A Strain of muscle, fascia and tendon of lower back, initial encounter: Secondary | ICD-10-CM | POA: Diagnosis not present

## 2016-10-02 DIAGNOSIS — S8002XA Contusion of left knee, initial encounter: Secondary | ICD-10-CM

## 2016-10-02 MED ORDER — NAPROXEN 500 MG PO TABS: 500.0000 mg | ORAL_TABLET | Freq: Two times a day (BID) | ORAL | 0 refills | Status: DC

## 2016-10-02 MED ORDER — CYCLOBENZAPRINE HCL 10 MG PO TABS: 10.0000 mg | ORAL_TABLET | Freq: Two times a day (BID) | ORAL | 0 refills | Status: DC | PRN
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
## 2016-10-06 ENCOUNTER — Ambulatory Visit: Payer: BLUE CROSS/BLUE SHIELD | Admitting: Nurse Practitioner

## 2016-10-13 ENCOUNTER — Ambulatory Visit: Payer: BLUE CROSS/BLUE SHIELD | Admitting: Nurse Practitioner

## 2016-10-13 ENCOUNTER — Ambulatory Visit (INDEPENDENT_AMBULATORY_CARE_PROVIDER_SITE_OTHER): Payer: BLUE CROSS/BLUE SHIELD | Admitting: Nurse Practitioner

## 2016-10-13 ENCOUNTER — Ambulatory Visit (INDEPENDENT_AMBULATORY_CARE_PROVIDER_SITE_OTHER)
Admission: RE | Admit: 2016-10-13 | Discharge: 2016-10-13 | Disposition: A | Discharge status: Home / Self Care | Payer: BLUE CROSS/BLUE SHIELD | Source: Ambulatory Visit | Attending: Nurse Practitioner | Admitting: Nurse Practitioner

## 2016-10-13 ENCOUNTER — Other Ambulatory Visit (INDEPENDENT_AMBULATORY_CARE_PROVIDER_SITE_OTHER): Payer: BLUE CROSS/BLUE SHIELD

## 2016-10-13 ENCOUNTER — Encounter: Payer: Self-pay | Admitting: Nurse Practitioner

## 2016-10-13 VITALS — BP 130/88 | HR 99 | Temp 98.0°F | Ht 66.0 in | Wt 204.0 lb

## 2016-10-13 DIAGNOSIS — J309 Allergic rhinitis, unspecified: Secondary | ICD-10-CM

## 2016-10-13 DIAGNOSIS — K068 Other specified disorders of gingiva and edentulous alveolar ridge: Secondary | ICD-10-CM

## 2016-10-13 DIAGNOSIS — Z7901 Long term (current) use of anticoagulants: Secondary | ICD-10-CM

## 2016-10-13 DIAGNOSIS — M25562 Pain in left knee: Secondary | ICD-10-CM | POA: Diagnosis not present

## 2016-10-13 DIAGNOSIS — Z7251 High risk heterosexual behavior: Secondary | ICD-10-CM | POA: Diagnosis not present

## 2016-10-13 MED ORDER — HYDROXYZINE HCL 10 MG PO TABS: 10.0000 mg | ORAL_TABLET | Freq: Three times a day (TID) | ORAL | 1 refills | Status: DC | PRN

## 2016-10-13 NOTE — Progress Notes (Signed)
Pre visit review using our clinic review tool, if applicable. No additional management support is needed unless otherwise documented below in the visit note. 
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
## 2016-10-13 NOTE — Patient Instructions (Addendum)
Normal knee x-ray.  Remind patient again about need for protected intercourse or use of progesterone contraceptive. Patient states she has not decided if she wants to use any contraceptive at this time.   She declined pelvic exam today. Bleeding Precautions When on Anticoagulant Therapy WHAT IS ANTICOAGULANT THERAPY? Anticoagulant therapy is taking medicine to prevent or reduce blood clots. It is also called blood thinner therapy. Blood clots that form in your blood vessels can be dangerous. They can break loose and travel to your heart, lungs, or brain. This increases your risk of a heart attack or stroke. Anticoagulant therapy causes blood to clot more slowly. You may need anticoagulant therapy if you have:  A medical condition that increases the likelihood that blood clots will form.  A heart defect or a problem with heart rhythm. It is also a common treatment after heart surgery, such as valve replacement. WHAT ARE COMMON TYPES OF ANTICOAGULANT THERAPY? Anticoagulant medicine can be injected or taken by mouth.If you need anticoagulant therapy quickly at the hospital, the medicine may be injected under your skin or given through an IV tube. Heparin is a common example of an anticoagulant that you may get at the hospital. Most anticoagulant therapy is in the form of pills that you take at home every day. These may include:  Aspirin. This common blood thinner works by preventing blood cells (platelets) from sticking together to form a clot. Aspirin is not as strong as anticoagulants that slow down the time that it takes for your body to form a clot.  Clopidogrel. This is a newer type of drug that affects platelets. It is stronger than aspirin.  Warfarin. This is the most common anticoagulant. It changes the way your body uses vitamin K, a vitamin that helps your blood to clot. The risk of bleeding is higher with warfarin than with aspirin. You will need frequent blood tests to make sure you  are taking the safest amount.  New anticoagulants. Several new drugs have been approved. They are all taken by mouth. Studies show that these drugs work as well as warfarin. They do not require blood testing. They may cause less bleeding risk than warfarin. WHAT DO I NEED TO REMEMBER WHEN TAKING ANTICOAGULANT THERAPY? Anticoagulant therapy decreases your risk of forming a blood clot, but it increases your risk of bleeding. Work closely with your health care provider to make sure you are taking your medicine safely. These tips can help:  Learn ways to reduce your risk of bleeding.  If you are taking warfarin:  Have blood tests as ordered by your health care provider.  Do not make any sudden changes to your diet. Vitamin K in your diet can make warfarin less effective.  Do not get pregnant. This medicine may cause birth defects.  Take your medicine at the same time every day. If you forget to take your medicine, take it as soon as you remember. If you miss a whole day, do not double your dose of medicine. Take your normal dose and call your health care provider to check in.  Do not stop taking your medicine on your own.  Tell your health care provider before you start taking any new medicine, vitamin, or herbal product. Some of these could interfere with your therapy.  Tell all of your health care providers that you are on anticoagulant therapy.  Do not have surgery, medical procedures, or dental work until you tell your health care provider that you are on anticoagulant therapy.  WHAT CAN AFFECT HOW ANTICOAGULANTS WORK? Certain foods, vitamins, medicines, supplements, and herbal medicines change the way that anticoagulant therapy works. They may increase or decrease the effects of your anticoagulant therapy. Either result can be dangerous for you.  Many over-the-counter medicines for pain, colds, or stomach problems interfere with anticoagulant therapy. Take these only as told by your  health care provider.  Do not drink alcohol. It can interfere with your medicine and increase your risk of an injury that causes bleeding.  If you are taking warfarin, do not begin eating more foods that contain vitamin K. These include leafy green vegetables. Ask your health care provider if you should avoid any foods. WHAT ARE SOME WAYS TO PREVENT BLEEDING? You can prevent bleeding by taking certain precautions:  Be extra careful when you use knives, scissors, or other sharp objects.  Use an electric razor instead of a blade.  Do not use toothpicks.  Use a soft toothbrush.  Wear shoes that have nonskid soles.  Use bath mats and handrails in your bathroom.  Wear gloves while you do yard work.  Wear a helmet when you ride a bike.  Wear your seat belt.  Prevent falls by removing loose rugs and extension cords from areas where you walk.  Do not play contact sports or participate in other activities that have a high risk of injury. WHEN SHOULD I CONTACT MY HEALTH CARE PROVIDER? Call your health care provider if:  You miss a dose of medicine:  And you are not sure what to do.  For more than one day.  You have:  Menstrual bleeding that is heavier than normal.  Blood in your urine.  A bloody nose or bleeding gums.  Easy bruising.  Blood in your stool (feces) or have black and tarry stool.  Side effects from your medicine.  You feel weak or dizzy.  You become pregnant. Seek immediate medical care if:  You have bleeding that will not stop.  You have sudden and severe headache or belly pain.  You vomit or you cough up bright red blood.  You have a severe blow to your head. WHAT ARE SOME QUESTIONS TO ASK MY HEALTH CARE PROVIDER?  What is the best anticoagulant therapy for my condition?  What side effects should I watch for?  When should I take my medicine? What should I do if I forget to take it?  Will I need to have regular blood tests?  Do I need to  change my diet? Are there foods or drinks that I should avoid?  What activities are safe for me?  What should I do if I want to get pregnant? This information is not intended to replace advice given to you by your health care provider. Make sure you discuss any questions you have with your health care provider. Document Released: 06/22/2015 Document Reviewed: 06/22/2015 Elsevier Interactive Patient Education  2017 [employer]

## 2016-10-14 LAB — POCT URINE PREGNANCY: Preg Test, Ur: NEGATIVE

## 2016-10-14 LAB — APTT: aPTT: 31.8 s (ref 23.4–32.7)

## 2016-10-14 NOTE — Progress Notes (Signed)
Normal results

## 2016-10-14 NOTE — Progress Notes (Signed)
Normal x-ray. Use tylenol or naproxen for pain. Apply cold compress as needed.

## 2016-12-12 ENCOUNTER — Encounter (HOSPITAL_COMMUNITY): Payer: Self-pay | Admitting: *Deleted

## 2016-12-12 ENCOUNTER — Ambulatory Visit (HOSPITAL_COMMUNITY)
Admission: EM | Admit: 2016-12-12 | Discharge: 2016-12-12 | Disposition: A | Discharge status: Home / Self Care | Payer: BLUE CROSS/BLUE SHIELD | Attending: Family Medicine | Admitting: Family Medicine

## 2016-12-12 DIAGNOSIS — J209 Acute bronchitis, unspecified: Secondary | ICD-10-CM | POA: Diagnosis not present

## 2016-12-12 DIAGNOSIS — R059 Cough, unspecified: Secondary | ICD-10-CM

## 2016-12-12 DIAGNOSIS — R05 Cough: Secondary | ICD-10-CM

## 2016-12-12 MED ORDER — METHYLPREDNISOLONE 4 MG PO TBPK: ORAL_TABLET | ORAL | 0 refills | Status: DC

## 2016-12-12 MED ORDER — BENZONATATE 100 MG PO CAPS: 200.0000 mg | ORAL_CAPSULE | Freq: Three times a day (TID) | ORAL | 0 refills | Status: DC | PRN

## 2016-12-12 MED ORDER — IPRATROPIUM BROMIDE 0.06 % NA SOLN: 2.0000 | Freq: Four times a day (QID) | NASAL | 0 refills | Status: DC

## 2016-12-12 NOTE — ED Provider Notes (Signed)
CSN: [id number]     Arrival date & time 12/12/16  1934 History   None    Chief Complaint  Patient presents with  . Cough   (Consider location/radiation/quality/duration/timing/severity/associated sxs/prior Treatment) Patient c/o cough and uri sx's for 3 weeks.   The history is provided by the patient.  Cough  Cough characteristics:  Productive Sputum characteristics:  White Severity:  Moderate Onset quality:  Sudden Duration:  3 days Timing:  Constant Progression:  Worsening Chronicity:  New Smoker: no   Relieved by:  Nothing Worsened by:  Nothing Associated symptoms: sore throat     Past Medical History:  Diagnosis Date  . Allergy   . DVT (deep venous thrombosis) (HCC)   . Environmental allergies   . PE (pulmonary embolism)   . Protein S deficiency (HCC)    History reviewed. No pertinent surgical history. Family History  Problem Relation Age of Onset  . Hypertension Mother   . Fibromyalgia Mother   . Diabetes Daughter    Social History  Substance Use Topics  . Smoking status: Never Smoker  . Smokeless tobacco: Never Used  . Alcohol use No   OB History    No data available     Review of Systems  Constitutional: Negative.   HENT: Positive for postnasal drip and sore throat.   Eyes: Negative.   Respiratory: Positive for cough.   Cardiovascular: Negative.   Gastrointestinal: Negative.   Endocrine: Negative.   Genitourinary: Negative.   Musculoskeletal: Negative.   Allergic/Immunologic: Negative.   Neurological: Negative.   Hematological: Negative.   Psychiatric/Behavioral: Negative.     Allergies  Doxycycline; Histamine; Latex; Metronidazole; Penicillins; Tetracyclines & related; Fluconazole; and Hyoscyamine  Home Medications   Prior to Admission medications   Medication Sig Start Date End Date Taking? Authorizing Provider  benzonatate (TESSALON) 100 MG capsule Take 2 capsules (200 mg total) by mouth 3 (three) times daily as needed for cough.  12/12/16   Deatra Canter, FNP  cyclobenzaprine (FLEXERIL) 10 MG tablet Take 1 tablet (10 mg total) by mouth 2 (two) times daily as needed for muscle spasms. 10/02/16   Deatra Canter, FNP  fluticasone (FLONASE) 50 MCG/ACT nasal spray Place 2 sprays into both nostrils daily. 09/08/16   Nche, Bonna Gains, NP  hydrOXYzine (ATARAX/VISTARIL) 10 MG tablet Take 1 tablet (10 mg total) by mouth every 8 (eight) hours as needed for itching (allergies). 10/13/16   Nche, Bonna Gains, NP  ipratropium (ATROVENT) 0.06 % nasal spray Place 2 sprays into both nostrils 4 (four) times daily. 12/12/16   Deatra Canter, FNP  methylPREDNISolone (MEDROL DOSEPAK) 4 MG TBPK tablet Take 6-5-4-3-2-1 po qd 12/12/16   Deatra Canter, FNP  naproxen (NAPROSYN) 500 MG tablet Take 1 tablet (500 mg total) by mouth 2 (two) times daily with a meal. 10/02/16   Langford Carias, Anselm Pancoast, FNP  pantoprazole (PROTONIX) 40 MG tablet Take 1 tablet (40 mg total) by mouth daily. 09/08/16   Nche, Bonna Gains, NP  rivaroxaban (XARELTO) 20 MG TABS tablet Take 1 tablet (20 mg total) by mouth daily with supper. 09/08/16   Nche, Bonna Gains, NP   Meds Ordered and Administered this Visit  Medications - No data to display  BP 132/78 (BP Location: Right Arm)   Pulse 78   Temp 98.6 F (37 C) (Oral)   Resp 18   SpO2 100%  No data found.   Physical Exam  Constitutional: She is oriented to person, place, and time. She appears  well-developed and well-nourished.  HENT:  Head: Normocephalic and atraumatic.  Right Ear: External ear normal.  Left Ear: External ear normal.  Nose: Nose normal.  Mouth/Throat: Oropharynx is clear and moist.  Eyes: Conjunctivae and EOM are normal. Pupils are equal, round, and reactive to light.  Neck: Normal range of motion. Neck supple.  Cardiovascular: Normal rate, regular rhythm and normal heart sounds.   Pulmonary/Chest: Effort normal and breath sounds normal.  Abdominal: Soft. Bowel sounds are normal.   Neurological: She is alert and oriented to person, place, and time.  Nursing note and vitals reviewed.   Urgent Care Course     Procedures (including critical care time)  Labs Review Labs Reviewed - No data to display  Imaging Review No results found.   Visual Acuity Review  Right Eye Distance:   Left Eye Distance:   Bilateral Distance:    Right Eye Near:   Left Eye Near:    Bilateral Near:         MDM   1. Acute bronchitis, unspecified organism   2. Cough    Tessalon perles Medrol dose pack Atrovent nasal spray  Push po fluids, rest, tylenol and motrin otc prn as directed for fever, arthralgias, and myalgias.  Follow up prn if sx's continue or persist.   Deatra CanterOxford, Mohd Clemons J, [place]FNP 12/12/16 2043

## 2016-12-12 NOTE — ED Triage Notes (Signed)
Pt  Has  Has   A  Hacking  Cough  For  About  3  Days      Pt    Reports  The  Cough is  Productive  At  Times   She  Is  Awake  Alert  And  Oriented

## 2017-02-04 ENCOUNTER — Other Ambulatory Visit (HOSPITAL_COMMUNITY)
Admission: RE | Admit: 2017-02-04 | Discharge: 2017-02-04 | Disposition: A | Discharge status: Home / Self Care | Payer: BLUE CROSS/BLUE SHIELD | Source: Ambulatory Visit | Attending: Family Medicine | Admitting: Family Medicine

## 2017-02-04 ENCOUNTER — Encounter: Payer: Self-pay | Admitting: Family Medicine

## 2017-02-04 ENCOUNTER — Ambulatory Visit (INDEPENDENT_AMBULATORY_CARE_PROVIDER_SITE_OTHER): Payer: BLUE CROSS/BLUE SHIELD | Admitting: Family Medicine

## 2017-02-04 VITALS — BP 128/100 | HR 69 | Temp 97.9°F | Ht 66.0 in | Wt 208.8 lb

## 2017-02-04 DIAGNOSIS — B9689 Other specified bacterial agents as the cause of diseases classified elsewhere: Secondary | ICD-10-CM | POA: Diagnosis not present

## 2017-02-04 DIAGNOSIS — N949 Unspecified condition associated with female genital organs and menstrual cycle: Secondary | ICD-10-CM | POA: Insufficient documentation

## 2017-02-04 MED ORDER — FLUCONAZOLE 150 MG PO TABS: 150.0000 mg | ORAL_TABLET | Freq: Once | ORAL | 0 refills | Status: AC

## 2017-02-04 NOTE — Assessment & Plan Note (Signed)
Diflucan  Wet prep done Will call if anything else shows up positive

## 2017-02-04 NOTE — Progress Notes (Signed)
Patient ID: Bertram GalaDebora D Khiev, female    DOB: Nov 05, 1970  Age: [age] y.o. MRN: [id number]    Subjective:  Subjective  HPI Kobi D Figueira presents for vaginal itching and d/c -- d/c is thick, no odor,  No abd pain  Review of Systems  Constitutional: Negative for appetite change, diaphoresis, fatigue and unexpected weight change.  Eyes: Negative for pain, redness and visual disturbance.  Respiratory: Negative for cough, chest tightness, shortness of breath and wheezing.   Cardiovascular: Negative for chest pain, palpitations and leg swelling.  Endocrine: Negative for cold intolerance, heat intolerance, polydipsia, polyphagia and polyuria.  Genitourinary: Positive for vaginal discharge. Negative for difficulty urinating, dysuria and frequency.  Neurological: Negative for dizziness, light-headedness, numbness and headaches.    History Past Medical History:  Diagnosis Date  . Allergy   . DVT (deep venous thrombosis) (HCC)   . Environmental allergies   . PE (pulmonary embolism)   . Protein S deficiency (HCC)     She has no past surgical history on file.   Her family history includes Diabetes in her daughter; Fibromyalgia in her mother; Hypertension in her mother.She reports that she has never smoked. She has never used smokeless tobacco. She reports that she does not drink alcohol or use drugs.  Current Outpatient Prescriptions on File Prior to Visit  Medication Sig Dispense Refill  . fluticasone (FLONASE) 50 MCG/ACT nasal spray Place 2 sprays into both nostrils daily. 16 g 11  . hydrOXYzine (ATARAX/VISTARIL) 10 MG tablet Take 1 tablet (10 mg total) by mouth every 8 (eight) hours as needed for itching (allergies). 90 tablet 1  . ipratropium (ATROVENT) 0.06 % nasal spray Place 2 sprays into both nostrils 4 (four) times daily. 15 mL 0  . pantoprazole (PROTONIX) 40 MG tablet Take 1 tablet (40 mg total) by mouth daily. 30 tablet 3  . rivaroxaban (XARELTO) 20 MG TABS tablet Take 1 tablet (20 mg  total) by mouth daily with supper. 30 tablet 3   No current facility-administered medications on file prior to visit.      Objective:  Objective  Physical Exam  Abdominal: Soft. She exhibits no distension. There is no tenderness. There is no rebound, no guarding and no CVA tenderness.  Genitourinary: Pelvic exam was performed with patient supine. There is no rash, tenderness, lesion or injury on the right labia. There is no rash, tenderness, lesion or injury on the left labia. Cervix exhibits discharge. Cervix exhibits no motion tenderness and no friability. Right adnexum displays no mass, no tenderness and no fullness. Left adnexum displays no mass, no tenderness and no fullness. There is erythema in the vagina. No tenderness in the vagina. Vaginal discharge found.    Psychiatric: She has a normal mood and affect. Her behavior is normal. Judgment and thought content normal.  Nursing note and vitals reviewed.  BP (!) 128/100 (Cuff Size: Large)   Pulse 69   Temp 97.9 F (36.6 C) (Oral)   Ht 5\' 6"  (1.676 m)   Wt 208 lb 12 oz (94.7 kg)   SpO2 99%   BMI 33.69 kg/m  Wt Readings from Last 3 Encounters:  02/04/17 208 lb 12 oz (94.7 kg)  10/13/16 204 lb (92.5 kg)  09/08/16 202 lb (91.6 kg)     Lab Results  Component Value Date   WBC 4.8 09/08/2016   HGB 13.1 09/08/2016   HCT 39.5 09/08/2016   PLT 299.0 09/08/2016   GLUCOSE 90 09/08/2016   CHOL 144 02/19/2013  TRIG 40 02/19/2013   HDL 49 02/19/2013   LDLCALC 87 02/19/2013   ALT 14 09/08/2016   AST 15 09/08/2016   NA 139 09/08/2016   K 3.7 09/08/2016   CL 106 09/08/2016   CREATININE 0.75 09/08/2016   BUN 12 09/08/2016   CO2 30 09/08/2016   TSH 0.974 02/19/2013   INR 1.4 (H) 09/08/2016   HGBA1C 5.4 02/19/2013    No results found.   Assessment & Plan:  Plan  I have discontinued Ms. Ritthaler naproxen, cyclobenzaprine, methylPREDNISolone, and benzonatate. I am also having her start on fluconazole. Additionally, I am  having her maintain her fluticasone, rivaroxaban, pantoprazole, hydrOXYzine, and ipratropium.  Meds ordered this encounter  Medications  . fluconazole (DIFLUCAN) 150 MG tablet    Sig: Take 1 tablet (150 mg total) by mouth once. May repeat in 3 days prn    Dispense:  2 tablet    Refill:  0    Problem List Items Addressed This Visit      Unprioritized   Vaginal discomfort - Primary    Diflucan  Wet prep done Will call if anything else shows up positive      Relevant Orders   Cervicovaginal ancillary only      Follow-up: Return in about 3 weeks (around 02/25/2017), or if symptoms worsen or fail to improve, for with pcp for bp.  Donato Schultz, DO

## 2017-02-04 NOTE — Patient Instructions (Signed)

## 2017-02-06 ENCOUNTER — Other Ambulatory Visit: Payer: BLUE CROSS/BLUE SHIELD

## 2017-02-06 ENCOUNTER — Encounter: Payer: Self-pay | Admitting: Family Medicine

## 2017-02-06 DIAGNOSIS — N949 Unspecified condition associated with female genital organs and menstrual cycle: Secondary | ICD-10-CM

## 2017-02-06 LAB — CERVICOVAGINAL ANCILLARY ONLY: WET PREP (BD AFFIRM): POSITIVE — AB

## 2017-02-07 ENCOUNTER — Other Ambulatory Visit: Payer: Self-pay | Admitting: Nurse Practitioner

## 2017-02-07 ENCOUNTER — Telehealth: Payer: Self-pay | Admitting: Nurse Practitioner

## 2017-02-07 MED ORDER — METRONIDAZOLE 500 MG PO TABS: 500.0000 mg | ORAL_TABLET | Freq: Two times a day (BID) | ORAL | 0 refills | Status: DC

## 2017-02-07 MED ORDER — METRONIDAZOLE 0.75 % VA GEL: 1.0000 | Freq: Every day | VAGINAL | 0 refills | Status: DC

## 2017-02-07 NOTE — Telephone Encounter (Signed)
Patient states script for metronidazole was sent to her pharmacy after being [facility]. Patient states she is allergic to this medication.  Patient is requesting something in place of this medication.  Patient states she can use metronidazole cream.  Patient uses [facility] at [address]

## 2017-02-07 NOTE — Telephone Encounter (Signed)
Ok to do metrogel 1 app pv qhs x 5 days

## 2017-02-07 NOTE — Telephone Encounter (Signed)
Sent in prescription/patient notified 

## 2017-03-10 ENCOUNTER — Other Ambulatory Visit: Payer: Self-pay | Admitting: Nurse Practitioner

## 2017-03-10 DIAGNOSIS — IMO0001 Reserved for inherently not codable concepts without codable children: Secondary | ICD-10-CM

## 2017-03-10 DIAGNOSIS — Z7901 Long term (current) use of anticoagulants: Secondary | ICD-10-CM

## 2017-03-10 DIAGNOSIS — D6859 Other primary thrombophilia: Secondary | ICD-10-CM

## 2017-03-10 DIAGNOSIS — I82501 Chronic embolism and thrombosis of unspecified deep veins of right lower extremity: Principal | ICD-10-CM

## 2017-03-10 MED ORDER — PANTOPRAZOLE SODIUM 40 MG PO TBEC: 40.0000 mg | DELAYED_RELEASE_TABLET | Freq: Every day | ORAL | 0 refills | Status: DC

## 2017-03-10 MED ORDER — RIVAROXABAN 20 MG PO TABS: 20.0000 mg | ORAL_TABLET | Freq: Every day | ORAL | 0 refills | Status: DC

## 2017-03-10 NOTE — Telephone Encounter (Signed)
Pt called for a 90 day supply of her XARELTO and her PROTONIX Establish PCP w/ Claris Gower 09/08/2016

## 2017-03-10 NOTE — Telephone Encounter (Signed)
Per chart pt is due for f/u in sept per office policy will send 30 day to local pharmacy until appt. Rx sent to [facility]...Belinda Adams

## 2017-03-14 NOTE — Telephone Encounter (Signed)
Received a massage from nurse line stating pt is upset because we didn't tell her about the appt she needed first in order for [place] to send in more refills. Pt stated she will find another PCP file a complaint about this. FYI

## 2017-03-18 ENCOUNTER — Encounter (HOSPITAL_COMMUNITY): Payer: Self-pay | Admitting: Emergency Medicine

## 2017-03-18 DIAGNOSIS — R1033 Periumbilical pain: Secondary | ICD-10-CM | POA: Insufficient documentation

## 2017-03-18 DIAGNOSIS — Z86711 Personal history of pulmonary embolism: Secondary | ICD-10-CM | POA: Diagnosis not present

## 2017-03-18 DIAGNOSIS — Z7901 Long term (current) use of anticoagulants: Secondary | ICD-10-CM | POA: Insufficient documentation

## 2017-03-18 DIAGNOSIS — Y999 Unspecified external cause status: Secondary | ICD-10-CM | POA: Diagnosis not present

## 2017-03-18 DIAGNOSIS — Z86718 Personal history of other venous thrombosis and embolism: Secondary | ICD-10-CM | POA: Insufficient documentation

## 2017-03-18 DIAGNOSIS — Y92481 Parking lot as the place of occurrence of the external cause: Secondary | ICD-10-CM | POA: Diagnosis not present

## 2017-03-18 DIAGNOSIS — M545 Low back pain: Secondary | ICD-10-CM | POA: Diagnosis not present

## 2017-03-18 DIAGNOSIS — Y939 Activity, unspecified: Secondary | ICD-10-CM | POA: Insufficient documentation

## 2017-03-18 DIAGNOSIS — R109 Unspecified abdominal pain: Secondary | ICD-10-CM | POA: Diagnosis present

## 2017-03-18 LAB — COMPREHENSIVE METABOLIC PANEL
ALT: 20 U/L (ref 14–54)
AST: 22 U/L (ref 15–41)
Albumin: 3.8 g/dL (ref 3.5–5.0)
Alkaline Phosphatase: 61 U/L (ref 38–126)
Anion gap: 8 (ref 5–15)
BUN: 12 mg/dL (ref 6–20)
CHLORIDE: 107 mmol/L (ref 101–111)
CO2: 25 mmol/L (ref 22–32)
Calcium: 9.6 mg/dL (ref 8.9–10.3)
Creatinine, Ser: 0.73 mg/dL (ref 0.44–1.00)
Glucose, Bld: 91 mg/dL (ref 65–99)
POTASSIUM: 3.6 mmol/L (ref 3.5–5.1)
Sodium: 140 mmol/L (ref 135–145)
Total Bilirubin: 0.5 mg/dL (ref 0.3–1.2)
Total Protein: 7.4 g/dL (ref 6.5–8.1)

## 2017-03-18 LAB — URINALYSIS, ROUTINE W REFLEX MICROSCOPIC
BACTERIA UA: NONE SEEN
BILIRUBIN URINE: NEGATIVE
Glucose, UA: NEGATIVE mg/dL
Hgb urine dipstick: NEGATIVE
KETONES UR: NEGATIVE mg/dL
LEUKOCYTES UA: NEGATIVE
Nitrite: NEGATIVE
PH: 6 (ref 5.0–8.0)
Protein, ur: NEGATIVE mg/dL
Specific Gravity, Urine: 1.03 (ref 1.005–1.030)

## 2017-03-18 LAB — CBC
HEMATOCRIT: 37.8 % (ref 36.0–46.0)
HEMOGLOBIN: 12.6 g/dL (ref 12.0–15.0)
MCH: 29.6 pg (ref 26.0–34.0)
MCHC: 33.3 g/dL (ref 30.0–36.0)
MCV: 88.9 fL (ref 78.0–100.0)
Platelets: 297 10*3/uL (ref 150–400)
RBC: 4.25 MIL/uL (ref 3.87–5.11)
RDW: 14.3 % (ref 11.5–15.5)
WBC: 6.4 10*3/uL (ref 4.0–10.5)

## 2017-03-18 LAB — LIPASE, BLOOD: Lipase: 25 U/L (ref 11–51)
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
## 2017-03-19 ENCOUNTER — Emergency Department (HOSPITAL_COMMUNITY)
Admission: EM | Admit: 2017-03-19 | Discharge: 2017-03-19 | Disposition: A | Discharge status: Home / Self Care | Payer: BLUE CROSS/BLUE SHIELD | Attending: Emergency Medicine | Admitting: Emergency Medicine

## 2017-03-19 ENCOUNTER — Emergency Department (HOSPITAL_COMMUNITY): Payer: BLUE CROSS/BLUE SHIELD

## 2017-03-19 DIAGNOSIS — R1033 Periumbilical pain: Secondary | ICD-10-CM | POA: Diagnosis not present

## 2017-03-19 MED ORDER — METHOCARBAMOL 500 MG PO TABS: 500.0000 mg | ORAL_TABLET | Freq: Two times a day (BID) | ORAL | 0 refills | Status: DC

## 2017-03-19 MED ORDER — IOPAMIDOL (ISOVUE-300) INJECTION 61%
INTRAVENOUS | Status: AC
  Filled 2017-03-19: qty 100

## 2017-03-19 MED ORDER — IOPAMIDOL (ISOVUE-300) INJECTION 61%
100.0000 mL | Freq: Once | INTRAVENOUS | Status: AC | PRN
  Administered 2017-03-19: 100 mL via INTRAVENOUS
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
## 2017-03-19 NOTE — ED Notes (Signed)
Patient transported to CT 
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
## 2017-04-03 ENCOUNTER — Encounter (HOSPITAL_COMMUNITY): Payer: Self-pay

## 2017-04-03 ENCOUNTER — Emergency Department (HOSPITAL_COMMUNITY)
Admission: EM | Admit: 2017-04-03 | Discharge: 2017-04-03 | Disposition: A | Discharge status: Home / Self Care | Payer: No Typology Code available for payment source | Attending: Emergency Medicine | Admitting: Emergency Medicine

## 2017-04-03 DIAGNOSIS — Z91040 Latex allergy status: Secondary | ICD-10-CM | POA: Insufficient documentation

## 2017-04-03 DIAGNOSIS — Z7901 Long term (current) use of anticoagulants: Secondary | ICD-10-CM | POA: Diagnosis not present

## 2017-04-03 DIAGNOSIS — M545 Low back pain, unspecified: Secondary | ICD-10-CM

## 2017-04-03 DIAGNOSIS — Z79899 Other long term (current) drug therapy: Secondary | ICD-10-CM | POA: Insufficient documentation

## 2017-04-03 MED ORDER — LIDOCAINE 5 % EX PTCH
1.0000 | MEDICATED_PATCH | CUTANEOUS | Status: DC
  Administered 2017-04-03: 1 via TRANSDERMAL
  Filled 2017-04-03: qty 1

## 2017-04-03 MED ORDER — METHYLPREDNISOLONE SODIUM SUCC 125 MG IJ SOLR
80.0000 mg | Freq: Once | INTRAMUSCULAR | Status: AC
  Administered 2017-04-03: 80 mg via INTRAMUSCULAR
  Filled 2017-04-03: qty 2

## 2017-04-03 MED ORDER — CYCLOBENZAPRINE HCL 10 MG PO TABS: 10.0000 mg | ORAL_TABLET | Freq: Two times a day (BID) | ORAL | 0 refills | Status: DC | PRN

## 2017-04-03 MED ORDER — CYCLOBENZAPRINE HCL 10 MG PO TABS
10.0000 mg | ORAL_TABLET | Freq: Once | ORAL | Status: AC
  Administered 2017-04-03: 10 mg via ORAL
  Filled 2017-04-03: qty 1

## 2017-04-03 MED ORDER — LIDOCAINE 5 % EX PTCH: 1.0000 | MEDICATED_PATCH | CUTANEOUS | 0 refills | Status: DC

## 2017-04-03 NOTE — ED Notes (Signed)
Patient ambulated in hallway with this nurse. Experienced x3 "spasms" to her back. EDPA notified.
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
## 2017-04-03 NOTE — Discharge Instructions (Signed)
Take it easy, but do not lay around too much as this may make any stiffness worse.  Tylenol: May take this as needed for pain. Muscle relaxer: Flexeril is a muscle relaxer and may help loosen stiff muscles. Do not take the Flexeril while driving or performing other dangerous activities.  Lidocaine patches: These are available via either prescription or over-the-counter. The over-the-counter option may be more economical one and are likely just as effective. There are multiple over-the-counter brands, such as Salonpas. Exercises: Be sure to perform the attached exercises starting with three times a week and working up to performing them daily. This is an essential part of preventing long term problems.   Follow up with a primary care provider for any future management of these complaints. May also follow-up with the orthopedic specialist.
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
## 2017-04-04 MED ORDER — CEFAZOLIN SODIUM-DEXTROSE 2-4 GM/100ML-% IV SOLN
INTRAVENOUS | Status: AC
  Filled 2017-04-04: qty 100

## 2017-05-25 ENCOUNTER — Encounter (HOSPITAL_COMMUNITY): Payer: Self-pay | Admitting: Family Medicine

## 2017-05-25 ENCOUNTER — Ambulatory Visit (HOSPITAL_COMMUNITY)
Admission: EM | Admit: 2017-05-25 | Discharge: 2017-05-25 | Disposition: A | Discharge status: Home / Self Care | Payer: Self-pay | Attending: Emergency Medicine | Admitting: Emergency Medicine

## 2017-05-25 DIAGNOSIS — G8929 Other chronic pain: Secondary | ICD-10-CM

## 2017-05-25 DIAGNOSIS — S39012D Strain of muscle, fascia and tendon of lower back, subsequent encounter: Secondary | ICD-10-CM

## 2017-05-25 DIAGNOSIS — M545 Low back pain: Secondary | ICD-10-CM

## 2017-05-25 DIAGNOSIS — S39012S Strain of muscle, fascia and tendon of lower back, sequela: Secondary | ICD-10-CM

## 2017-05-25 MED ORDER — PREDNISONE 10 MG (21) PO TBPK: ORAL_TABLET | ORAL | 0 refills | Status: DC

## 2017-05-25 NOTE — Discharge Instructions (Signed)
Try using heat, from a care wraps or other similar-type heating pad, Solonpas menthol patches and stretches as demonstrated

## 2017-05-25 NOTE — ED Notes (Signed)
Discharged by provider
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
## 2017-06-08 ENCOUNTER — Ambulatory Visit (HOSPITAL_COMMUNITY)
Admission: RE | Admit: 2017-06-08 | Discharge: 2017-06-08 | Disposition: A | Discharge status: Home / Self Care | Payer: Self-pay | Source: Ambulatory Visit | Attending: Chiropractic Medicine | Admitting: Chiropractic Medicine

## 2017-06-08 ENCOUNTER — Other Ambulatory Visit (HOSPITAL_COMMUNITY): Payer: Self-pay | Admitting: Chiropractic Medicine

## 2017-06-08 DIAGNOSIS — M545 Low back pain: Secondary | ICD-10-CM

## 2017-06-08 DIAGNOSIS — M5137 Other intervertebral disc degeneration, lumbosacral region: Secondary | ICD-10-CM | POA: Insufficient documentation

## 2017-06-24 ENCOUNTER — Other Ambulatory Visit: Payer: Self-pay

## 2017-06-24 ENCOUNTER — Emergency Department (HOSPITAL_COMMUNITY)
Admission: EM | Admit: 2017-06-24 | Discharge: 2017-06-24 | Disposition: A | Discharge status: Home / Self Care | Payer: Self-pay | Attending: Emergency Medicine | Admitting: Emergency Medicine

## 2017-06-24 DIAGNOSIS — Z91040 Latex allergy status: Secondary | ICD-10-CM | POA: Insufficient documentation

## 2017-06-24 DIAGNOSIS — Z880 Allergy status to penicillin: Secondary | ICD-10-CM | POA: Insufficient documentation

## 2017-06-24 DIAGNOSIS — N3000 Acute cystitis without hematuria: Secondary | ICD-10-CM

## 2017-06-24 DIAGNOSIS — Z79899 Other long term (current) drug therapy: Secondary | ICD-10-CM | POA: Insufficient documentation

## 2017-06-24 DIAGNOSIS — N3090 Cystitis, unspecified without hematuria: Secondary | ICD-10-CM | POA: Insufficient documentation

## 2017-06-24 DIAGNOSIS — T310 Burns involving less than 10% of body surface: Secondary | ICD-10-CM | POA: Insufficient documentation

## 2017-06-24 DIAGNOSIS — Y939 Activity, unspecified: Secondary | ICD-10-CM | POA: Insufficient documentation

## 2017-06-24 DIAGNOSIS — T2104XA Burn of unspecified degree of lower back, initial encounter: Secondary | ICD-10-CM | POA: Insufficient documentation

## 2017-06-24 DIAGNOSIS — X19XXXA Contact with other heat and hot substances, initial encounter: Secondary | ICD-10-CM | POA: Insufficient documentation

## 2017-06-24 DIAGNOSIS — Y998 Other external cause status: Secondary | ICD-10-CM | POA: Insufficient documentation

## 2017-06-24 DIAGNOSIS — Z23 Encounter for immunization: Secondary | ICD-10-CM | POA: Insufficient documentation

## 2017-06-24 DIAGNOSIS — Y929 Unspecified place or not applicable: Secondary | ICD-10-CM | POA: Insufficient documentation

## 2017-06-24 LAB — URINALYSIS, ROUTINE W REFLEX MICROSCOPIC
Bilirubin Urine: NEGATIVE
GLUCOSE, UA: NEGATIVE mg/dL
HGB URINE DIPSTICK: NEGATIVE
Ketones, ur: NEGATIVE mg/dL
NITRITE: NEGATIVE
PH: 5 (ref 5.0–8.0)
Protein, ur: NEGATIVE mg/dL
SPECIFIC GRAVITY, URINE: 1.02 (ref 1.005–1.030)

## 2017-06-24 MED ORDER — SILVER SULFADIAZINE 1 % EX CREA
TOPICAL_CREAM | Freq: Once | CUTANEOUS | Status: AC
  Administered 2017-06-24: 1 via TOPICAL
  Filled 2017-06-24: qty 50

## 2017-06-24 MED ORDER — NITROFURANTOIN MONOHYD MACRO 100 MG PO CAPS: 100.0000 mg | ORAL_CAPSULE | Freq: Two times a day (BID) | ORAL | 0 refills | Status: DC

## 2017-06-24 MED ORDER — TETANUS-DIPHTH-ACELL PERTUSSIS 5-2.5-18.5 LF-MCG/0.5 IM SUSP
0.5000 mL | Freq: Once | INTRAMUSCULAR | Status: AC
  Administered 2017-06-24: 0.5 mL via INTRAMUSCULAR
  Filled 2017-06-24: qty 0.5

## 2017-06-24 MED ORDER — PHENAZOPYRIDINE HCL 200 MG PO TABS: 200.0000 mg | ORAL_TABLET | Freq: Three times a day (TID) | ORAL | 0 refills | Status: DC

## 2017-06-24 NOTE — Discharge Instructions (Signed)
Change the dressing every 12 hours. Return as needed.
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
## 2017-06-25 LAB — URINE CULTURE: Special Requests: NORMAL

## 2017-06-26 ENCOUNTER — Telehealth: Payer: Self-pay | Admitting: *Deleted

## 2017-06-26 NOTE — Telephone Encounter (Signed)
Received call back from patient.  States she is feeling much better.  Advised to D/C Macrobid.  Patient verbalized understanding.

## 2017-06-26 NOTE — Telephone Encounter (Signed)
Post ED Visit - Positive Culture Follow-up: Unsuccessful Patient Follow-up  Culture assessed and recommendations reviewed by:  []  Enzo BiNathan Batchelder, Pharm.D. []  Celedonio MiyamotoJeremy Frens, Pharm.D., BCPS AQ-ID []  Garvin FilaMike Maccia, Pharm.D., BCPS [x]  Georgina PillionElizabeth Martin, Pharm.D., BCPS []  [place]h Pham, Vermont[place]arm.D., BCPS, AAHIVP []  Estella HuskMichelle Turner, Pharm.D., BCPS, AAHIVP []  Lysle Pearlachel Rumbarger, PharmD, BCPS []  Casilda Carlsaylor Stone, PharmD, BCPS []  Pollyann SamplesAndy Johnston, PharmD, BCPS  Positive urine culture, reviewed by Leary RocaMichael Maczis, PA-C  []  Patient discharged without antimicrobial prescription and treatment is now indicated []  Organism is resistant to prescribed ED discharge antimicrobial []  Patient with positive blood cultures   Unable to contact patient after 3 attempts, letter will be sent to address on file  Lysle PearlRobertson, Emberlynn Riggan Talley 06/26/2017, 11:10 AM

## 2017-08-08 ENCOUNTER — Encounter: Payer: Self-pay | Admitting: Nurse Practitioner

## 2017-08-08 ENCOUNTER — Ambulatory Visit: Payer: BLUE CROSS/BLUE SHIELD | Admitting: Nurse Practitioner

## 2017-08-08 ENCOUNTER — Other Ambulatory Visit (INDEPENDENT_AMBULATORY_CARE_PROVIDER_SITE_OTHER): Payer: BLUE CROSS/BLUE SHIELD

## 2017-08-08 VITALS — BP 140/92 | HR 80 | Temp 98.0°F | Resp 18 | Ht 66.0 in | Wt 217.0 lb

## 2017-08-08 DIAGNOSIS — IMO0001 Reserved for inherently not codable concepts without codable children: Secondary | ICD-10-CM

## 2017-08-08 DIAGNOSIS — Z7901 Long term (current) use of anticoagulants: Secondary | ICD-10-CM

## 2017-08-08 DIAGNOSIS — I82501 Chronic embolism and thrombosis of unspecified deep veins of right lower extremity: Secondary | ICD-10-CM

## 2017-08-08 DIAGNOSIS — J309 Allergic rhinitis, unspecified: Secondary | ICD-10-CM | POA: Diagnosis not present

## 2017-08-08 DIAGNOSIS — J209 Acute bronchitis, unspecified: Secondary | ICD-10-CM | POA: Diagnosis not present

## 2017-08-08 DIAGNOSIS — D6859 Other primary thrombophilia: Secondary | ICD-10-CM

## 2017-08-08 DIAGNOSIS — K219 Gastro-esophageal reflux disease without esophagitis: Secondary | ICD-10-CM | POA: Diagnosis not present

## 2017-08-08 LAB — CBC WITH DIFFERENTIAL/PLATELET
BASOS PCT: 1 % (ref 0.0–3.0)
Basophils Absolute: 0 10*3/uL (ref 0.0–0.1)
EOS ABS: 0.1 10*3/uL (ref 0.0–0.7)
Eosinophils Relative: 2 % (ref 0.0–5.0)
HCT: 41.1 % (ref 36.0–46.0)
Hemoglobin: 13.5 g/dL (ref 12.0–15.0)
LYMPHS ABS: 1.9 10*3/uL (ref 0.7–4.0)
Lymphocytes Relative: 41.4 % (ref 12.0–46.0)
MCHC: 32.9 g/dL (ref 30.0–36.0)
MCV: 91.1 fl (ref 78.0–100.0)
MONO ABS: 0.4 10*3/uL (ref 0.1–1.0)
Monocytes Relative: 8.6 % (ref 3.0–12.0)
NEUTROS ABS: 2.2 10*3/uL (ref 1.4–7.7)
NEUTROS PCT: 47 % (ref 43.0–77.0)
PLATELETS: 340 10*3/uL (ref 150.0–400.0)
RBC: 4.51 Mil/uL (ref 3.87–5.11)
RDW: 14.6 % (ref 11.5–15.5)
WBC: 4.7 10*3/uL (ref 4.0–10.5)

## 2017-08-08 MED ORDER — PANTOPRAZOLE SODIUM 40 MG PO TBEC: 40.0000 mg | DELAYED_RELEASE_TABLET | Freq: Every day | ORAL | 1 refills | Status: DC

## 2017-08-08 MED ORDER — RIVAROXABAN 20 MG PO TABS: 20.0000 mg | ORAL_TABLET | Freq: Every day | ORAL | 0 refills | Status: DC

## 2017-08-08 MED ORDER — HYDROXYZINE HCL 10 MG PO TABS: 10.0000 mg | ORAL_TABLET | Freq: Three times a day (TID) | ORAL | 1 refills | Status: DC | PRN

## 2017-08-08 MED ORDER — DM-GUAIFENESIN ER 30-600 MG PO TB12: 1.0000 | ORAL_TABLET | Freq: Two times a day (BID) | ORAL | 0 refills | Status: DC | PRN

## 2017-08-08 MED ORDER — ALBUTEROL SULFATE HFA 108 (90 BASE) MCG/ACT IN AERS: 1.0000 | INHALATION_SPRAY | Freq: Four times a day (QID) | RESPIRATORY_TRACT | 0 refills | Status: DC | PRN

## 2017-08-08 NOTE — Assessment & Plan Note (Signed)
Will follow up with GYN For continue vaginal bleeding Medications ordered: - rivaroxaban (XARELTO) 20 MG TABS tablet; Take 1 tablet (20 mg total) by mouth daily with supper. Follow-up appt due in Sept must see provider for future refills  Dispense: 30 tablet; Refill: 0 Diagnostic testing ordered: - CBC with Differential/Platelet; Future 

## 2017-08-08 NOTE — Assessment & Plan Note (Signed)
Will follow up with GYN For continue vaginal bleeding Medications ordered: - rivaroxaban (XARELTO) 20 MG TABS tablet; Take 1 tablet (20 mg total) by mouth daily with supper. Follow-up appt due in Sept must see provider for future refills  Dispense: 30 tablet; Refill: 0 Diagnostic testing ordered: - CBC with Differential/Platelet; Future

## 2017-08-08 NOTE — Patient Instructions (Addendum)
Please head downstairs for lab work.  Please schedule follow up with your GYN for vaginal bleeding.   For your cough and wheezing, I have sent a prescription for an albuterol inhaler and mucinex. You can use the inhaler 1-2 puffs every 6 hours for coughing and shortness of breath. You can take the mucinex 1 tablet twice daily for cough. Please let me know if you develop fevers, if are not feeling better, if your symptoms get worse by Friday.  I will see you back in about 6 months ,or sooner if you need me.  It was nice to meet you. Thanks for letting me take care of you today :)     Acute Bronchitis, Adult Acute bronchitis is when air tubes (bronchi) in the lungs suddenly get swollen. The condition can make it hard to breathe. It can also cause these symptoms:  A cough.  Coughing up clear, yellow, or green mucus.  Wheezing.  Chest congestion.  Shortness of breath.  A fever.  Body aches.  Chills.  A sore throat.  Follow these instructions at home: Medicines  Take over-the-counter and prescription medicines only as told by your doctor.  If you were prescribed an antibiotic medicine, take it as told by your doctor. Do not stop taking the antibiotic even if you start to feel better. General instructions  Rest.  Drink enough fluids to keep your pee (urine) clear or pale yellow.  Avoid smoking and secondhand smoke. If you smoke and you need help quitting, ask your doctor. Quitting will help your lungs heal faster.  Use an inhaler, cool mist vaporizer, or humidifier as told by your doctor.  Keep all follow-up visits as told by your doctor. This is important. How is this prevented? To lower your risk of getting this condition again:  Wash your hands often with soap and water. If you cannot use soap and water, use hand sanitizer.  Avoid contact with people who have cold symptoms.  Try not to touch your hands to your mouth, nose, or eyes.  Make sure to get the flu  shot every year.  Contact a doctor if:  Your symptoms do not get better in 2 weeks. Get help right away if:  You cough up blood.  You have chest pain.  You have very bad shortness of breath.  You become dehydrated.  You faint (pass out) or keep feeling like you are going to pass out.  You keep throwing up (vomiting).  You have a very bad headache.  Your fever or chills gets worse. This information is not intended to replace advice given to you by your health care provider. Make sure you discuss any questions you have with your health care provider. Document Released: 12/28/2007 Document Revised: 02/17/2016 Document Reviewed: 12/30/2015 Elsevier Interactive Patient Education  [employer]2018 Elsevier Inc.

## 2017-08-08 NOTE — Assessment & Plan Note (Signed)
-   hydrOXYzine (ATARAX/VISTARIL) 10 MG tablet; Take 1 tablet (10 mg total) by mouth every 8 (eight) hours as needed for itching (allergies).  Dispense: 90 tablet; Refill: 1

## 2017-08-08 NOTE — Assessment & Plan Note (Signed)
-   pantoprazole (PROTONIX) 40 MG tablet; Take 1 tablet (40 mg total) by mouth daily.  Dispense: 90 tablet; Refill: 1

## 2017-08-08 NOTE — Progress Notes (Signed)
Name: Belinda Adams   MRN: [id number]    DOB: Jan 12, 1971   Date:08/08/2017       Progress Note  Subjective  Chief Complaint  Chief Complaint  Patient presents with  . Establish Care    1 week has had congestion, SOB, some wheezing, feels like something is in her throat    HPI Belinda Adams presents today to establish care. She has an acute complaint of congestion. She is also requesting refills of her daily medications.  Congestion-  This is a new problem. The symptoms began about 1 week ago She reports headaches, Postnasal drip, Congestion, wheezing, dry cough. She denies fevers, malaise, body aches, chest pain or shortness of breath. She does not feel ill.  GERD-maintained on protonix as needed- after she eats spicy foods. She denies current symptoms. She has tried nexium in the past with no relief.  Allergic rhinitis-  She reports she often experiences itching related to allergies.  She takes hydroxyzine prn with relief. Denies rashes, swelling.  Protein s deficiency- maintained on xarelto She was diagnosed with protein s deficiency years ago. She has bene taking xarelto for at least 5 years now. She has had DVTs in the past-last in 2014 She has had scant vaginal bleeding since the summer- she plans to follow up with GYN for this problem. She denies any weakness, syncope, pelvic pain, edema today.   Patient Active Problem List   Diagnosis Date Noted  . Vaginal discomfort 02/04/2017  . Allergic rhinitis 09/08/2016  . Elevated BP 04/18/2013  . DVT, recurrent, lower extremity, chronic (HCC) 02/19/2013  . Protein S deficiency (HCC) 02/19/2013    Social History   Tobacco Use  . Smoking status: Never Smoker  . Smokeless tobacco: Never Used  Substance Use Topics  . Alcohol use: No     Current Outpatient Medications:  .  acetaminophen (TYLENOL) 500 MG tablet, Take 1,000 mg by mouth every 6 (six) hours as needed for moderate pain., Disp: , Rfl:  .  fluticasone  (FLONASE) 50 MCG/ACT nasal spray, Place 2 sprays into both nostrils daily. (Patient taking differently: Place 2 sprays into both nostrils daily as needed for allergies. ), Disp: 16 g, Rfl: 11 .  hydrOXYzine (ATARAX/VISTARIL) 10 MG tablet, Take 1 tablet (10 mg total) by mouth every 8 (eight) hours as needed for itching (allergies)., Disp: 90 tablet, Rfl: 1 .  methocarbamol (ROBAXIN) 500 MG tablet, Take 1 tablet (500 mg total) by mouth 2 (two) times daily., Disp: 20 tablet, Rfl: 0 .  pantoprazole (PROTONIX) 40 MG tablet, Take 40 mg by mouth daily., Disp: , Rfl:  .  rivaroxaban (XARELTO) 20 MG TABS tablet, Take 1 tablet (20 mg total) by mouth daily with supper. Follow-up appt due in Sept must see provider for future refills, Disp: 30 tablet, Rfl: 0  Allergies  Allergen Reactions  . Doxycycline Anaphylaxis, Hives and Nausea And Vomiting  . Histamine Nausea And Vomiting  . Latex Hives and Itching  . Metronidazole Hives and Itching  . Penicillins Hives and Itching  . Tetracyclines & Related Hives, Nausea And Vomiting and Other (See Comments)    Stomach cramping  . Tyloxapol Hives  . Fluconazole Nausea And Vomiting  . Hyoscyamine Nausea And Vomiting    ROS See HPI  Objective  Vitals:   08/08/17 1207  BP: (!) 140/92  Pulse: 80  Resp: 18  Temp: 98 F (36.7 C)  TempSrc: Oral  SpO2: 98%  Weight: 217 lb (98.4 kg)  Height:  5\' 6"  (1.676 m)    Body mass index is 35.02 kg/m.   Physical Exam  Constitutional: She is oriented to person, place, and time and well-developed, well-nourished, and in no distress. No distress.  HENT:  Head: Normocephalic and atraumatic.  Right Ear: Tympanic membrane, external ear and ear canal normal.  Left Ear: Tympanic membrane, external ear and ear canal normal.  Nose: Rhinorrhea present.  Mouth/Throat: Uvula is midline, oropharynx is clear and moist and mucous membranes are normal.  Eyes: Conjunctivae are normal. Pupils are equal, round, and reactive to  light. Right eye exhibits no discharge. Left eye exhibits no discharge.  Neck: Normal range of motion. Neck supple.  Cardiovascular: Normal rate, regular rhythm, normal heart sounds and intact distal pulses.  Pulmonary/Chest: Effort normal and breath sounds normal. No respiratory distress.  Abdominal: Soft. She exhibits no distension.  Musculoskeletal: She exhibits no edema.  Neurological: She is alert and oriented to person, place, and time. Gait normal.  Skin: Skin is warm and dry. No rash noted. No erythema.  Psychiatric: Mood, affect and judgment normal.  Vitals reviewed.   Assessment & Plan RTC in 6 months for routine follow up (xarelto, labwork)  Acute bronchitis, unspecified organism Medications ordered: - dextromethorphan-guaiFENesin (MUCINEX DM) 30-600 MG 12hr tablet; Take 1 tablet by mouth 2 (two) times daily as needed for cough.  Dispense: 14 tablet; Refill: 0 - albuterol (PROVENTIL HFA;VENTOLIN HFA) 108 (90 Base) MCG/ACT inhaler; Inhale 1-2 puffs into the lungs every 6 (six) hours as needed for wheezing or shortness of breath.  Dispense: 1 Inhaler; Refill: 0 -Red flags and when to present for emergency care or RTC including fever >101.21F, chest pain, shortness of breath, new/worsening/un-resolving symptoms,  reviewed with patient at time of visit. Follow up and care instructions discussed and provided in AVS.

## 2017-08-17 ENCOUNTER — Telehealth: Payer: Self-pay | Admitting: Nurse Practitioner

## 2017-08-17 MED ORDER — AZITHROMYCIN 250 MG PO TABS: ORAL_TABLET | ORAL | 0 refills | Status: DC

## 2017-08-17 NOTE — Telephone Encounter (Signed)
I have sent a z-pak to her pharmacy. She will take 2 pills on day 1 then 1 pill daily for the remainder of the pack.

## 2017-08-17 NOTE — Telephone Encounter (Signed)
Please advise 

## 2017-08-17 NOTE — Telephone Encounter (Signed)
Copied from CRM [phone]. Topic: Quick Communication - See Telephone Encounter >> Aug 17, 2017 12:57 PM Cipriano BunkerLambe, Annette S wrote: CRM for notification. See Telephone encounter for:   Please call patient  Patient came by office and got an inhaler and it is not working,  very congested and coughing stuff up a lot.  Still Not feeling good.    Asking for a different prescription to be called into:   [phone] [place] - [address] AT [facility] [address] [place] [id number] Phone: [phone] Fax: [phone]   08/17/17.

## 2017-08-18 ENCOUNTER — Telehealth: Payer: Self-pay | Admitting: Nurse Practitioner

## 2017-08-18 MED ORDER — AZITHROMYCIN 250 MG PO TABS: ORAL_TABLET | ORAL | 0 refills | Status: DC

## 2017-08-18 NOTE — Telephone Encounter (Signed)
Updated pharmacy and [facility] to [facility]...Raechel Chute/lmb

## 2017-08-18 NOTE — Telephone Encounter (Signed)
LVM letting pt know.  

## 2017-08-18 NOTE — Telephone Encounter (Signed)
Copied from CRM [phone]. Topic: General - Other >> Aug 18, 2017 12:37 PM Gerrianne ScalePayne, Angela L wrote: Reason for CRM: patient calling back stating that she had told someone that she uses  [facility] [id number] - [place], [place] - [address] AT [facility] [address] [phone] (Phone) [phone] (Fax)  So the azithromycin (ZITHROMAX) 250 MG [phone] - [place], [place] - [address] AT [facility] [address] [phone] (Phone) [phone] (Fax)           she would like for you to [facility] list

## 2017-08-18 NOTE — Addendum Note (Signed)
Addended by: Deatra JamesBRAND, Lazara Grieser M on: 08/18/2017 01:58 PM   Modules accepted: Orders

## 2017-09-25 ENCOUNTER — Other Ambulatory Visit: Payer: Self-pay

## 2017-09-25 DIAGNOSIS — D6859 Other primary thrombophilia: Secondary | ICD-10-CM

## 2017-09-25 DIAGNOSIS — Z7901 Long term (current) use of anticoagulants: Secondary | ICD-10-CM

## 2017-09-25 MED ORDER — RIVAROXABAN 20 MG PO TABS: 20.0000 mg | ORAL_TABLET | Freq: Every day | ORAL | 0 refills | Status: DC

## 2017-10-13 ENCOUNTER — Encounter: Payer: Self-pay | Admitting: Family

## 2017-10-13 ENCOUNTER — Ambulatory Visit: Payer: BLUE CROSS/BLUE SHIELD | Admitting: Family

## 2017-10-13 VITALS — BP 140/82 | HR 97 | Temp 98.1°F | Ht 66.0 in | Wt 217.0 lb

## 2017-10-13 DIAGNOSIS — R053 Chronic cough: Secondary | ICD-10-CM

## 2017-10-13 DIAGNOSIS — R05 Cough: Secondary | ICD-10-CM | POA: Diagnosis not present

## 2017-10-13 MED ORDER — AZITHROMYCIN 250 MG PO TABS: ORAL_TABLET | ORAL | 0 refills | Status: DC

## 2017-10-13 MED ORDER — BENZONATATE 100 MG PO CAPS: 100.0000 mg | ORAL_CAPSULE | Freq: Three times a day (TID) | ORAL | 0 refills | Status: DC | PRN

## 2017-10-13 NOTE — Progress Notes (Signed)
Belinda SensingDebora D Adams is a 47 y.o. female with the following history as recorded in EpicCare:  Patient Active Problem List   Diagnosis Date Noted  . Current use of long term anticoagulation 08/08/2017  . GERD (gastroesophageal reflux disease) 08/08/2017  . Vaginal discomfort 02/04/2017  . Allergic rhinitis 09/08/2016  . Elevated BP 04/18/2013  . DVT, recurrent, lower extremity, chronic (HCC) 02/19/2013  . Protein S deficiency (HCC) 02/19/2013    Current Outpatient Medications  Medication Sig Dispense Refill  . albuterol (PROVENTIL HFA;VENTOLIN HFA) 108 (90 Base) MCG/ACT inhaler Inhale 1-2 puffs into the lungs every 6 (six) hours as needed for wheezing or shortness of breath. 1 Inhaler 0  . cyclobenzaprine (FLEXERIL) 10 MG tablet   0  . dextromethorphan-guaiFENesin (MUCINEX DM) 30-600 MG 12hr tablet Take 1 tablet by mouth 2 (two) times daily as needed for cough. 14 tablet 0  . fluticasone (FLONASE) 50 MCG/ACT nasal spray Place 2 sprays into both nostrils daily. (Patient taking differently: Place 2 sprays into both nostrils daily as needed for allergies. ) 16 g 11  . hydrOXYzine (ATARAX/VISTARIL) 10 MG tablet Take 1 tablet (10 mg total) by mouth every 8 (eight) hours as needed for itching (allergies). 90 tablet 1  . pantoprazole (PROTONIX) 40 MG tablet Take 1 tablet (40 mg total) by mouth daily. 90 tablet 1  . rivaroxaban (XARELTO) 20 MG TABS tablet Take 1 tablet (20 mg total) by mouth daily with supper. 30 tablet 0  . azithromycin (ZITHROMAX) 250 MG tablet 2 tabs po qd x 1 day; 1 tablet per day x 4 days; 6 tablet 0  . benzonatate (TESSALON) 100 MG capsule Take 1 capsule (100 mg total) by mouth 3 (three) times daily as needed. 20 capsule 0   No current facility-administered medications for this visit.     Allergies: Doxycycline; Histamine; Latex; Metronidazole; Penicillins; Tetracyclines & related; Tyloxapol; Fluconazole; and Hyoscyamine  Past Medical History:  Diagnosis Date  . Allergy   .  DVT (deep venous thrombosis) (HCC)   . Environmental allergies   . PE (pulmonary embolism)   . Protein S deficiency (HCC)     History reviewed. No pertinent surgical history.  Family History  Problem Relation Age of Onset  . Hypertension Mother   . Fibromyalgia Mother   . Diabetes Daughter     Social History   Tobacco Use  . Smoking status: Never Smoker  . Smokeless tobacco: Never Used  Substance Use Topics  . Alcohol use: No    Subjective:  Started on Monday with headache/ sore throat; + burning in chest; feels like congested in my head; denies any chest pain or shortness of breath; was treated for acute bronchitis in January- does feel that infection resolved; notes that all her co-workers are sick and feels this is why she is sick again; has taken OTC Flonase;   Objective:  Vitals:   10/13/17 1332  BP: 140/82  Pulse: 97  Temp: 98.1 F (36.7 C)  TempSrc: Oral  SpO2: 99%  Weight: 217 lb (98.4 kg)  Height: 5\' 6"  (1.676 m)    General: Well developed, well nourished, in no acute distress  Skin : Warm and dry.  Head: Normocephalic and atraumatic  Eyes: Sclera and conjunctiva clear; pupils round and reactive to light; extraocular movements intact  Ears: External normal; canals clear; tympanic membranes normal  Oropharynx: Pink, supple. No suspicious lesions  Neck: Supple without thyromegaly, adenopathy  Lungs: Respirations unlabored; clear to auscultation bilaterally without wheeze, rales, rhonchi  CVS exam: normal rate and regular rhythm.  Neurologic: Alert and oriented; speech intact; face symmetrical; moves all extremities well; CNII-XII intact without focal deficit  Assessment:  1. Chronic cough     Plan:  Recommended CXR due to recurrent symptoms- she defers; will treat with Z-pak, Tessalon Perles; encouraged to use Flonase as prescribed especially during allergy season; work note given as requested; increase fluids, rest and follow up worse, no better.   No  follow-ups on file.  Orders Placed This Encounter  Procedures  . DG Chest 2 View    Standing Status:   Future    Standing Expiration Date:   12/14/2018    Order Specific Question:   Reason for Exam (SYMPTOM  OR DIAGNOSIS REQUIRED)    Answer:   chronic cough    Order Specific Question:   Is patient pregnant?    Answer:   No    Order Specific Question:   Preferred imaging location?    Answer:   [facility]    Order Specific Question:   Radiology Contrast Protocol - do NOT remove file path    Answer:   \\[email or web address].pdf    Requested Prescriptions   Signed Prescriptions Disp Refills  . benzonatate (TESSALON) 100 MG capsule 20 capsule 0    Sig: Take 1 capsule (100 mg total) by mouth 3 (three) times daily as needed.  Marland Kitchen azithromycin (ZITHROMAX) 250 MG tablet 6 tablet 0    Sig: 2 tabs po qd x 1 day; 1 tablet per day x 4 days;

## 2017-10-21 ENCOUNTER — Telehealth: Payer: Self-pay | Admitting: Nurse Practitioner

## 2017-10-21 DIAGNOSIS — Z7901 Long term (current) use of anticoagulants: Secondary | ICD-10-CM

## 2017-10-21 DIAGNOSIS — D6859 Other primary thrombophilia: Secondary | ICD-10-CM

## 2017-10-21 NOTE — Telephone Encounter (Signed)
Pt came in today wanting to get a refill on xarelto 20mg  tablets  [facility] [address] number [phone]  Pt wants to know why she needs to call every month to get this refilled she has an appointment in July  Please call pt

## 2017-10-23 ENCOUNTER — Other Ambulatory Visit: Payer: Self-pay

## 2017-10-23 DIAGNOSIS — D6859 Other primary thrombophilia: Secondary | ICD-10-CM

## 2017-10-23 DIAGNOSIS — Z7901 Long term (current) use of anticoagulants: Secondary | ICD-10-CM

## 2017-10-23 MED ORDER — RIVAROXABAN 20 MG PO TABS: 20.0000 mg | ORAL_TABLET | Freq: Every day | ORAL | 3 refills | Status: DC

## 2017-10-23 MED ORDER — RIVAROXABAN 20 MG PO TABS: 20.0000 mg | ORAL_TABLET | Freq: Every day | ORAL | 0 refills | Status: DC

## 2017-10-23 NOTE — Telephone Encounter (Signed)
Rx refilled.

## 2017-11-09 ENCOUNTER — Telehealth: Payer: Self-pay | Admitting: Nurse Practitioner

## 2017-11-09 DIAGNOSIS — J309 Allergic rhinitis, unspecified: Secondary | ICD-10-CM

## 2017-11-09 MED ORDER — FLUTICASONE PROPIONATE 50 MCG/ACT NA SUSP: 2.0000 | Freq: Every day | NASAL | 5 refills | Status: DC

## 2017-11-09 NOTE — Telephone Encounter (Signed)
Rx sent 

## 2017-11-09 NOTE — Telephone Encounter (Signed)
Copied from CRM [phone]. Topic: Quick Communication - See Telephone Encounter >> Nov 09, 2017  1:20 PM Windy KalataMichael, Romel Dumond L, NT wrote: CRM for notification. See Telephone encounter for: 11/09/17.  Patient would like a refill on fluticasone (FLONASE) 50 MCG/ACT nasal spray. Please advise  [facility] [phone] [place] [address] AT [facility] [address] [place] [phone] Phone: [phone] Fax: [phone]

## 2017-11-09 NOTE — Telephone Encounter (Signed)
Request refill for Flonase. This prescription expired on 09/08/17 and was last refilled by Alysia Pennaharlotte Nche, NP.   Sarajane Marekpcp  Ashleigh Shambley, NP  LOV  10/13/17  Pharmacy  [facility] [address]   Please review.

## 2017-12-20 ENCOUNTER — Telehealth: Payer: Self-pay

## 2017-12-20 NOTE — Telephone Encounter (Signed)
Copied from CRM [id number]. Topic: General - Other >> Dec 20, 2017  2:50 PM Marylen Ponto wrote: Reason for CRM: [name] with [facility] requests that the rivaroxaban (XARELTO) 20 MG TABS tablet is held for 2 days prior to the injection. Belinda Adams Reason states the procedure is scheduled for 01/02/18. Cb# [phone]

## 2017-12-22 NOTE — Telephone Encounter (Signed)
Yes it is okay to hold the xarelto for 2 days prior to the injection

## 2017-12-22 NOTE — Telephone Encounter (Signed)
Returned call and gave permission for pt to stop xarelto 2 days prior to injection.

## 2018-02-06 ENCOUNTER — Ambulatory Visit: Payer: BLUE CROSS/BLUE SHIELD | Admitting: Nurse Practitioner

## 2018-02-06 ENCOUNTER — Encounter (INDEPENDENT_AMBULATORY_CARE_PROVIDER_SITE_OTHER): Payer: Self-pay

## 2018-02-16 ENCOUNTER — Ambulatory Visit: Payer: BLUE CROSS/BLUE SHIELD | Admitting: Nurse Practitioner

## 2018-02-16 ENCOUNTER — Encounter: Payer: Self-pay | Admitting: Nurse Practitioner

## 2018-02-16 VITALS — BP 144/96 | HR 89 | Temp 98.2°F | Resp 16 | Ht 66.0 in | Wt 212.8 lb

## 2018-02-16 DIAGNOSIS — R030 Elevated blood-pressure reading, without diagnosis of hypertension: Secondary | ICD-10-CM

## 2018-02-16 DIAGNOSIS — Z7901 Long term (current) use of anticoagulants: Secondary | ICD-10-CM

## 2018-02-16 DIAGNOSIS — K219 Gastro-esophageal reflux disease without esophagitis: Secondary | ICD-10-CM

## 2018-02-16 DIAGNOSIS — J309 Allergic rhinitis, unspecified: Secondary | ICD-10-CM | POA: Diagnosis not present

## 2018-02-16 DIAGNOSIS — D6859 Other primary thrombophilia: Secondary | ICD-10-CM

## 2018-02-16 DIAGNOSIS — L905 Scar conditions and fibrosis of skin: Secondary | ICD-10-CM | POA: Diagnosis not present

## 2018-02-16 MED ORDER — PANTOPRAZOLE SODIUM 40 MG PO TBEC: 40.0000 mg | DELAYED_RELEASE_TABLET | Freq: Every day | ORAL | 1 refills | Status: AC

## 2018-02-16 MED ORDER — HYDROXYZINE HCL 10 MG PO TABS: 10.0000 mg | ORAL_TABLET | Freq: Three times a day (TID) | ORAL | 1 refills | Status: AC | PRN

## 2018-02-16 MED ORDER — RIVAROXABAN 20 MG PO TABS: 20.0000 mg | ORAL_TABLET | Freq: Every day | ORAL | 3 refills | Status: DC

## 2018-02-16 MED ORDER — FLUTICASONE PROPIONATE 50 MCG/ACT NA SUSP: 2.0000 | Freq: Every day | NASAL | 5 refills | Status: DC

## 2018-02-16 NOTE — Assessment & Plan Note (Addendum)
Refills today F/U for new or worsening symptoms - fluticasone (FLONASE) 50 MCG/ACT nasal spray; Place 2 sprays into both nostrils daily.  Dispense: 16 g; Refill: 5 - hydrOXYzine (ATARAX/VISTARIL) 10 MG tablet; Take 1 tablet (10 mg total) by mouth every 8 (eight) hours as needed for itching (allergies).  Dispense: 90 tablet; Refill: 1

## 2018-02-16 NOTE — Assessment & Plan Note (Signed)
Refill today F/u for new, worsening symptoms - pantoprazole (PROTONIX) 40 MG tablet; Take 1 tablet (40 mg total) by mouth daily.  Dispense: 90 tablet; Refill: 1
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
## 2018-02-16 NOTE — Patient Instructions (Signed)
Please try to check your blood pressure once daily or at least a few times a week, at the same time each day, and keep a log. Blood pressure goal is less than 140/90  You can try mederma or vitamin E oil for scarring. If no improvement, we could send you to a dermatologist or plastic surgeon for further options.  Please return in about 2 weeks for an annual physical with fasting lab work, we will check your blood counts then so we dont have to stick you twice

## 2018-02-16 NOTE — Assessment & Plan Note (Signed)
Stable Continue current medications Update CBC at CPE in 2 weeks - rivaroxaban (XARELTO) 20 MG TABS tablet; Take 1 tablet (20 mg total) by mouth daily with supper.  Dispense: 30 tablet; Refill: 3

## 2018-02-16 NOTE — Assessment & Plan Note (Signed)
Stable Continue current medications Update CBC at CPE in 2 weeks - rivaroxaban (XARELTO) 20 MG TABS tablet; Take 1 tablet (20 mg total) by mouth daily with supper.  Dispense: 30 tablet; Refill: 3 

## 2018-03-09 ENCOUNTER — Encounter: Payer: Self-pay | Admitting: Nurse Practitioner

## 2018-03-09 ENCOUNTER — Other Ambulatory Visit (INDEPENDENT_AMBULATORY_CARE_PROVIDER_SITE_OTHER): Payer: BLUE CROSS/BLUE SHIELD

## 2018-03-09 ENCOUNTER — Ambulatory Visit (INDEPENDENT_AMBULATORY_CARE_PROVIDER_SITE_OTHER): Payer: BLUE CROSS/BLUE SHIELD | Admitting: Nurse Practitioner

## 2018-03-09 VITALS — BP 152/94 | HR 88 | Temp 98.5°F | Resp 16 | Ht 66.0 in | Wt 213.8 lb

## 2018-03-09 DIAGNOSIS — K219 Gastro-esophageal reflux disease without esophagitis: Secondary | ICD-10-CM

## 2018-03-09 DIAGNOSIS — Z0001 Encounter for general adult medical examination with abnormal findings: Secondary | ICD-10-CM | POA: Diagnosis not present

## 2018-03-09 DIAGNOSIS — Z7901 Long term (current) use of anticoagulants: Secondary | ICD-10-CM | POA: Diagnosis not present

## 2018-03-09 DIAGNOSIS — E559 Vitamin D deficiency, unspecified: Secondary | ICD-10-CM

## 2018-03-09 DIAGNOSIS — E669 Obesity, unspecified: Secondary | ICD-10-CM

## 2018-03-09 DIAGNOSIS — L905 Scar conditions and fibrosis of skin: Secondary | ICD-10-CM

## 2018-03-09 DIAGNOSIS — D6859 Other primary thrombophilia: Secondary | ICD-10-CM | POA: Diagnosis not present

## 2018-03-09 DIAGNOSIS — Z13220 Encounter for screening for lipoid disorders: Secondary | ICD-10-CM

## 2018-03-09 DIAGNOSIS — R030 Elevated blood-pressure reading, without diagnosis of hypertension: Secondary | ICD-10-CM

## 2018-03-09 DIAGNOSIS — E66811 Obesity, class 1: Secondary | ICD-10-CM

## 2018-03-09 LAB — COMPREHENSIVE METABOLIC PANEL
ALBUMIN: 4.2 g/dL (ref 3.5–5.2)
ALT: 12 U/L (ref 0–35)
AST: 18 U/L (ref 0–37)
Alkaline Phosphatase: 67 U/L (ref 39–117)
BUN: 13 mg/dL (ref 6–23)
CHLORIDE: 106 meq/L (ref 96–112)
CO2: 30 meq/L (ref 19–32)
Calcium: 9.8 mg/dL (ref 8.4–10.5)
Creatinine, Ser: 0.8 mg/dL (ref 0.40–1.20)
GFR: 98.03 mL/min (ref >60.00)
Glucose, Bld: 86 mg/dL (ref 70–99)
POTASSIUM: 3.8 meq/L (ref 3.5–5.1)
SODIUM: 140 meq/L (ref 135–145)
Total Bilirubin: 0.3 mg/dL (ref 0.2–1.2)
Total Protein: 7.5 g/dL (ref 6.0–8.3)

## 2018-03-09 LAB — LIPID PANEL
CHOL/HDL RATIO: 3
CHOLESTEROL: 161 mg/dL (ref 0–200)
HDL: 51.9 mg/dL (ref >39.00)
LDL CALC: 97 mg/dL (ref 0–99)
NONHDL: 108.65
Triglycerides: 59 mg/dL (ref 0.0–149.0)
VLDL: 11.8 mg/dL (ref 0.0–40.0)

## 2018-03-09 LAB — CBC
HEMATOCRIT: 40.1 % (ref 36.0–46.0)
HEMOGLOBIN: 12.9 g/dL (ref 12.0–15.0)
MCHC: 32.1 g/dL (ref 30.0–36.0)
MCV: 89.8 fl (ref 78.0–100.0)
Platelets: 304 10*3/uL (ref 150.0–400.0)
RBC: 4.47 Mil/uL (ref 3.87–5.11)
RDW: 14.6 % (ref 11.5–15.5)
WBC: 5 10*3/uL (ref 4.0–10.5)

## 2018-03-09 LAB — MAGNESIUM: Magnesium: 2 mg/dL (ref 1.5–2.5)

## 2018-03-09 LAB — TSH: TSH: 1.29 u[IU]/mL (ref 0.35–4.50)

## 2018-03-09 NOTE — Progress Notes (Signed)
Name: Belinda Adams   MRN: [id number]    DOB: October 05, 1970   Date:03/09/2018       Progress Note  Subjective  Chief Complaint  Chief Complaint  Patient presents with  . CPE    not fasting    HPI  Patient presents for annual CPE. She is also requesting to discuss several other issues: referral to dermatology for skin scarring, which we discussed at her last OV on 02/16/18.  She has been trying scar cream with minimal improvement of the scar. She is also requesting to decrease her dosage of xarelto today, maintained on for daily xarelto for many years for protein S deficiency, no recent specialty follow up, and concerned today that her BP is elevated due to her xarelto dosage. She would like vitamin D level checked this year, has been low in past  Diet, Exercise: tries to watch her diet, admits she has not been exercising as much recently but is planning to start back  USPSTF grade A and B recommendations  Depression: no concerns Depression screen [facility] 2/9 10/13/2017 09/08/2016 02/19/2013  Decreased Interest 0 0 0  Down, Depressed, Hopeless 0 0 0  PHQ - 2 Score 0 0 0   Hypertension: not currently maintained on medications, reports checking BP readings at home with normal recent readings, feels her elevated BP is related to xarelto dose, would like to reduce dose BP Readings from Last 3 Encounters:  03/09/18 (!) 152/94  02/16/18 (!) 144/96  10/13/17 140/82   Obesity:  Wt Readings from Last 3 Encounters:  03/09/18 213 lb 12.8 oz (97 kg)  02/16/18 212 lb 12.8 oz (96.5 kg)  10/13/17 217 lb (98.4 kg)   BMI Readings from Last 3 Encounters:  03/09/18 34.51 kg/m  02/16/18 34.35 kg/m  10/13/17 35.02 kg/m    Alcohol: no Tobacco use: no  HIV: screening done in past STD testing and prevention (chl/gon/syphilis): declines screening  Intimate partner violence: feels safe  Vaccinations: up to date  Advanced Care Planning: A voluntary discussion about advance care planning including  the explanation and discussion of advance directives.  Discussed health care proxy and Living will, and the patient DOES NOT have a living will at present time. If patient does have living will, I have requested they bring this to the clinic to be scanned in to their chart.  Breast cancer: reports mammo up to date, ordered by OBGYN Cervical cancer screening: reports PAP up to date, done by OBGYN  Lipids: lipid panel today Lab Results  Component Value Date   CHOL 144 02/19/2013   CHOL 170 06/04/2010   Lab Results  Component Value Date   HDL 49 02/19/2013   HDL 49 06/04/2010   Lab Results  Component Value Date   LDLCALC 87 02/19/2013   LDLCALC 110 (H) 06/04/2010   Lab Results  Component Value Date   TRIG 40 02/19/2013   TRIG 55 06/04/2010   Lab Results  Component Value Date   CHOLHDL 2.9 02/19/2013   CHOLHDL 3.5 Ratio 06/04/2010   No results found for: LDLDIRECT  Glucose:  Glucose, Bld  Date Value Ref Range Status  03/18/2017 91 65 - 99 mg/dL Final  82/95/6213 90 70 - 99 mg/dL Final  08/65/7846 962 (H) 70 - 99 mg/dL Final    Skin cancer: does not routinely wear sunscreen  Colorectal cancer: No personal or family history of colon ca, no abdominal pain, no bowel changes, no rectal bleeding.   Patient Active Problem List  Diagnosis Date Noted  . Current use of long term anticoagulation 08/08/2017  . GERD (gastroesophageal reflux disease) 08/08/2017  . Vaginal discomfort 02/04/2017  . Allergic rhinitis 09/08/2016  . Elevated BP 04/18/2013  . DVT, recurrent, lower extremity, chronic (HCC) 02/19/2013  . Protein S deficiency (HCC) 02/19/2013    No past surgical history on file.  Family History  Problem Relation Age of Onset  . Hypertension Mother   . Fibromyalgia Mother   . Diabetes Daughter     Social History   Socioeconomic History  . Marital status: Divorced    Spouse name: Not on file  . Number of children: Not on file  . Years of education: Not on  file  . Highest education level: Not on file  Occupational History  . Not on file  Social Needs  . Financial resource strain: Not on file  . Food insecurity:    Worry: Not on file    Inability: Not on file  . Transportation needs:    Medical: Not on file    Non-medical: Not on file  Tobacco Use  . Smoking status: Never Smoker  . Smokeless tobacco: Never Used  Substance and Sexual Activity  . Alcohol use: No  . Drug use: No  . Sexual activity: Not on file  Lifestyle  . Physical activity:    Days per week: Not on file    Minutes per session: Not on file  . Stress: Not on file  Relationships  . Social connections:    Talks on phone: Not on file    Gets together: Not on file    Attends religious service: Not on file    Active member of club or organization: Not on file    Attends meetings of clubs or organizations: Not on file    Relationship status: Not on file  . Intimate partner violence:    Fear of current or ex partner: Not on file    Emotionally abused: Not on file    Physically abused: Not on file    Forced sexual activity: Not on file  Other Topics Concern  . Not on file  Social History Narrative  . Not on file     Current Outpatient Medications:  .  fluticasone (FLONASE) 50 MCG/ACT nasal spray, Place 2 sprays into both nostrils daily., Disp: 16 g, Rfl: 5 .  hydrOXYzine (ATARAX/VISTARIL) 10 MG tablet, Take 1 tablet (10 mg total) by mouth every 8 (eight) hours as needed for itching (allergies)., Disp: 90 tablet, Rfl: 1 .  pantoprazole (PROTONIX) 40 MG tablet, Take 1 tablet (40 mg total) by mouth daily., Disp: 90 tablet, Rfl: 1 .  rivaroxaban (XARELTO) 20 MG TABS tablet, Take 1 tablet (20 mg total) by mouth daily with supper., Disp: 30 tablet, Rfl: 3  Allergies  Allergen Reactions  . Doxycycline Anaphylaxis, Hives and Nausea And Vomiting  . Histamine Nausea And Vomiting  . Latex Hives and Itching  . Metronidazole Hives and Itching  . Penicillins Hives and  Itching  . Tetracyclines & Related Hives, Nausea And Vomiting and Other (See Comments)    Stomach cramping  . Tyloxapol Hives  . Fluconazole Nausea And Vomiting  . Hyoscyamine Nausea And Vomiting     ROS  Constitutional: Negative for fever or weight change.  Respiratory: Negative for cough and shortness of breath.   Cardiovascular: Negative for chest pain or palpitations.  Gastrointestinal: Negative for abdominal pain, no bowel changes.  Musculoskeletal: Negative for gait problem or joint swelling.  Skin: Negative for rash.  Neurological: Negative for dizziness or headache.  No other specific complaints in a complete review of systems (except as listed in HPI above).   Objective  Vitals:   03/09/18 1559  BP: (!) 152/94  Pulse: 88  Resp: 16  Temp: 98.5 F (36.9 C)  TempSrc: Oral  SpO2: 97%  Weight: 213 lb 12.8 oz (97 kg)  Height: 5\' 6"  (1.676 m)    Body mass index is 34.51 kg/m.  Physical Exam Vital signs reviewed. Constitutional: Patient appears well-developed and well-nourished. No distress.  HENT: Head: Normocephalic and atraumatic. Ears: B TMs ok, no erythema or effusion; Nose: Nose normal. Mouth/Throat: Oropharynx is clear and moist. No oropharyngeal exudate.  Eyes: Conjunctivae and EOM are normal. Pupils are equal, round, and reactive to light. No scleral icterus.  Neck: Normal range of motion. Neck supple.  Cardiovascular: Normal rate, regular rhythm and normal heart sounds.  No murmur heard. No BLE edema. Distal pulses intact Pulmonary/Chest: Effort normal and breath sounds normal. No respiratory distress. Abdominal: Soft. Bowel sounds are normal, no distension. There is no tenderness. no masses Breast: defd to GYN FEMALE GENITALIA:  defd to GYN Musculoskeletal: Normal range of motion, No gross deformities Neurological: She is alert and oriented to person, place, and time. No cranial nerve deficit. Coordination, balance, strength, speech and gait are normal.   Skin: Skin is warm and dry. No rash noted. No erythema.  Psychiatric: Patient has a normal mood and affect. behavior is normal. Judgment and thought content normal.   Assessment & Plan RTC in 6 months for routine F/U  Abnormal scarring of skin Referral to dermatology for further evaluation and management of scarring refractory to OTC treatments - Ambulatory referral to Dermatology

## 2018-03-09 NOTE — Patient Instructions (Addendum)
Please head downstairs for lab work. If any of your test results are critically abnormal, you will be contacted right away. Otherwise, I will contact you within a week about your test results and follow up recommendations  I have placed a referral to hematology to discuss protein S. Our office will begin processing this referral. Please follow up if you have not heard anything about this referral within 10 days.  Please try to check your blood pressure once daily or at least a few times a week, at the same time each day, and keep a log. Please follow up for readings >140/90  Please return in about 6 months for routine follow up, or sooner if needed.   DASH Eating Plan DASH stands for "Dietary Approaches to Stop Hypertension." The DASH eating plan is a healthy eating plan that has been shown to reduce high blood pressure (hypertension). It may also reduce your risk for type 2 diabetes, heart disease, and stroke. The DASH eating plan may also help with weight loss. What are tips for following this plan? General guidelines  Avoid eating more than 2,300 mg (milligrams) of salt (sodium) a day. If you have hypertension, you may need to reduce your sodium intake to 1,500 mg a day.  Limit alcohol intake to no more than 1 drink a day for nonpregnant women and 2 drinks a day for men. One drink equals 12 oz of beer, 5 oz of wine, or 1 oz of hard liquor.  Work with your health care provider to maintain a healthy body weight or to lose weight. Ask what an ideal weight is for you.  Get at least 30 minutes of exercise that causes your heart to beat faster (aerobic exercise) most days of the week. Activities may include walking, swimming, or biking.  Work with your health care provider or diet and nutrition specialist (dietitian) to adjust your eating plan to your individual calorie needs. Reading food labels  Check food labels for the amount of sodium per serving. Choose foods with less than 5 percent  of the Daily Value of sodium. Generally, foods with less than 300 mg of sodium per serving fit into this eating plan.  To find whole grains, look for the word "whole" as the first word in the ingredient list. Shopping  Buy products labeled as "low-sodium" or "no salt added."  Buy fresh foods. Avoid canned foods and premade or frozen meals. Cooking  Avoid adding salt when cooking. Use salt-free seasonings or herbs instead of table salt or sea salt. Check with your health care provider or pharmacist before using salt substitutes.  Do not fry foods. Cook foods using healthy methods such as baking, boiling, grilling, and broiling instead.  Cook with heart-healthy oils, such as olive, canola, soybean, or sunflower oil. Meal planning   Eat a balanced diet that includes: ? 5 or more servings of fruits and vegetables each day. At each meal, try to fill half of your plate with fruits and vegetables. ? Up to 6-8 servings of whole grains each day. ? Less than 6 oz of lean meat, poultry, or fish each day. A 3-oz serving of meat is about the same size as a deck of cards. One egg equals 1 oz. ? 2 servings of low-fat dairy each day. ? A serving of nuts, seeds, or beans 5 times each week. ? Heart-healthy fats. Healthy fats called Omega-3 fatty acids are found in foods such as flaxseeds and coldwater fish, like sardines, salmon, and mackerel.  how much you eat of the following: ? Canned or prepackaged foods. ? Food that is high in trans fat, such as fried foods. ? Food that is high in saturated fat, such as fatty meat. ? Sweets, desserts, sugary drinks, and other foods with added sugar. ? Full-fat dairy products.  Do not salt foods before eating.  Try to eat at least 2 vegetarian meals each week.  Eat more home-cooked food and less restaurant, buffet, and fast food.  When eating at a restaurant, ask that your food be prepared with less salt or no salt, if possible. What foods are  recommended? The items listed may not be a complete list. Talk with your dietitian about what dietary choices are best for you. Grains Whole-grain or whole-wheat bread. Whole-grain or whole-wheat pasta. Brown rice. Oatmeal. Quinoa. Bulgur. Whole-grain and low-sodium cereals. Pita bread. Low-fat, low-sodium crackers. Whole-wheat flour tortillas. Vegetables Fresh or frozen vegetables (raw, steamed, roasted, or grilled). Low-sodium or reduced-sodium tomato and vegetable juice. Low-sodium or reduced-sodium tomato sauce and tomato paste. Low-sodium or reduced-sodium canned vegetables. Fruits All fresh, dried, or frozen fruit. Canned fruit in natural juice (without added sugar). Meat and other protein foods Skinless chicken or turkey. Ground chicken or turkey. Pork with fat trimmed off. Fish and seafood. Egg whites. Dried beans, peas, or lentils. Unsalted nuts, nut butters, and seeds. Unsalted canned beans. Lean cuts of beef with fat trimmed off. Low-sodium, lean deli meat. Dairy Low-fat (1%) or fat-free (skim) milk. Fat-free, low-fat, or reduced-fat cheeses. Nonfat, low-sodium ricotta or cottage cheese. Low-fat or nonfat yogurt. Low-fat, low-sodium cheese. Fats and oils Soft margarine without trans fats. Vegetable oil. Low-fat, reduced-fat, or light mayonnaise and salad dressings (reduced-sodium). Canola, safflower, olive, soybean, and sunflower oils. Avocado. Seasoning and other foods Herbs. Spices. Seasoning mixes without salt. Unsalted popcorn and pretzels. Fat-free sweets. What foods are not recommended? The items listed may not be a complete list. Talk with your dietitian about what dietary choices are best for you. Grains Baked goods made with fat, such as croissants, muffins, or some breads. Dry pasta or rice meal packs. Vegetables Creamed or fried vegetables. Vegetables in a cheese sauce. Regular canned vegetables (not low-sodium or reduced-sodium). Regular canned tomato sauce and paste (not  low-sodium or reduced-sodium). Regular tomato and vegetable juice (not low-sodium or reduced-sodium). Pickles. Olives. Fruits Canned fruit in a light or heavy syrup. Fried fruit. Fruit in cream or butter sauce. Meat and other protein foods Fatty cuts of meat. Ribs. Fried meat. Bacon. Sausage. Bologna and other processed lunch meats. Salami. Fatback. Hotdogs. Bratwurst. Salted nuts and seeds. Canned beans with added salt. Canned or smoked fish. Whole eggs or egg yolks. Chicken or turkey with skin. Dairy Whole or 2% milk, cream, and half-and-half. Whole or full-fat cream cheese. Whole-fat or sweetened yogurt. Full-fat cheese. Nondairy creamers. Whipped toppings. Processed cheese and cheese spreads. Fats and oils Butter. Stick margarine. Lard. Shortening. Ghee. Bacon fat. Tropical oils, such as coconut, palm kernel, or palm oil. Seasoning and other foods Salted popcorn and pretzels. Onion salt, garlic salt, seasoned salt, table salt, and sea salt. Worcestershire sauce. Tartar sauce. Barbecue sauce. Teriyaki sauce. Soy sauce, including reduced-sodium. Steak sauce. Canned and packaged gravies. Fish sauce. Oyster sauce. Cocktail sauce. Horseradish that you find on the shelf. Ketchup. Mustard. Meat flavorings and tenderizers. Bouillon cubes. Hot sauce and Tabasco sauce. Premade or packaged marinades. Premade or packaged taco seasonings. Relishes. Regular salad dressings. Where to find more information:  National Heart, Lung, and Blood Institute:   www.nhlbi.nih.gov  American Heart Association: www.heart.org Summary  The DASH eating plan is a healthy eating plan that has been shown to reduce high blood pressure (hypertension). It may also reduce your risk for type 2 diabetes, heart disease, and stroke.  With the DASH eating plan, you should limit salt (sodium) intake to 2,300 mg a day. If you have hypertension, you may need to reduce your sodium intake to 1,500 mg a day.  When on the DASH eating plan,  aim to eat more fresh fruits and vegetables, whole grains, lean proteins, low-fat dairy, and heart-healthy fats.  Work with your health care provider or diet and nutrition specialist (dietitian) to adjust your eating plan to your individual calorie needs. This information is not intended to replace advice given to you by your health care provider. Make sure you discuss any questions you have with your health care provider. Document Released: 06/30/2011 Document Revised: 07/04/2016 Document Reviewed: 07/04/2016 Elsevier Interactive Patient Education  2018 Elsevier Inc.  

## 2018-03-12 LAB — VITAMIN D 25 HYDROXY (VIT D DEFICIENCY, FRACTURES): VITD: 17.36 ng/mL — AB (ref 30.00–100.00)

## 2018-03-16 ENCOUNTER — Encounter: Payer: Self-pay | Admitting: Nurse Practitioner

## 2018-03-16 ENCOUNTER — Telehealth: Payer: Self-pay | Admitting: Oncology

## 2018-03-16 ENCOUNTER — Other Ambulatory Visit: Payer: Self-pay | Admitting: Nurse Practitioner

## 2018-03-16 ENCOUNTER — Encounter: Payer: Self-pay | Admitting: Oncology

## 2018-03-16 DIAGNOSIS — E559 Vitamin D deficiency, unspecified: Secondary | ICD-10-CM

## 2018-03-16 MED ORDER — VITAMIN D (ERGOCALCIFEROL) 1.25 MG (50000 UNIT) PO CAPS: 50000.0000 [IU] | ORAL_CAPSULE | ORAL | 0 refills | Status: DC

## 2018-03-16 NOTE — Progress Notes (Signed)
orders

## 2018-03-16 NOTE — Telephone Encounter (Signed)
New referral received from Catarina HartshornAshley Shambley, NP for protein S deficiency. Pt has been called and scheduled to see Dr. Clelia CroftShadad on 9/12 at 2pm. Pt had asked to see Dr. Myna HidalgoEnnever, but when I told him where he was located, she declined to go to the [facility] stating that it is too far for her. Pt agreed to the 9/12 appt. Letter mailed.

## 2018-03-30 ENCOUNTER — Telehealth: Payer: Self-pay | Admitting: Hematology and Oncology

## 2018-03-30 NOTE — Telephone Encounter (Signed)
Pt cld to reschedule appt from 9/12 w/Dr. Clelia Croft to 10/8 at 345pm w/Dr. Pamelia Hoit. Pt has been made aware to arive 30 minutes early.

## 2018-04-05 ENCOUNTER — Encounter: Payer: BLUE CROSS/BLUE SHIELD | Admitting: Oncology

## 2018-04-16 DIAGNOSIS — E559 Vitamin D deficiency, unspecified: Secondary | ICD-10-CM | POA: Insufficient documentation

## 2018-04-16 DIAGNOSIS — Z0001 Encounter for general adult medical examination with abnormal findings: Secondary | ICD-10-CM | POA: Insufficient documentation

## 2018-04-16 NOTE — Assessment & Plan Note (Signed)
BP elevated on past two visits, she is adamant that is related to her xarelto dosage although she has been on this dosage for some time No recent evaluation by specialty, Referral will be made to hematology for further evaluation and management of protein S deficiency/xarelto dosing - CBC; Future - Comprehensive metabolic panel; Future - Ambulatory referral to Hematology / Oncology

## 2018-04-16 NOTE — Assessment & Plan Note (Signed)
Update labs F/U with further recommendations pending lab results - VITAMIN D 25 Hydroxy (Vit-D Deficiency, Fractures); Future 

## 2018-04-16 NOTE — Assessment & Plan Note (Addendum)
BP elevated on past two visits, she is adamant that is related to her xarelto dosage although she has been on this dosage for some time She declines antihypertensives although we discussed the long term risks of HTN including MI, CVA, renal failure Referral will be made to hematology for further evaluation and management of protein S deficiency/xarelto dosing She will continue to monitor BP readings at home and F/U for home readings >140/90 Additional education provided on AVS - CBC; Future - Comprehensive metabolic panel; Future - TSH; Future

## 2018-04-16 NOTE — Assessment & Plan Note (Signed)
Continue protonix Update labs - Magnesium; Future

## 2018-04-16 NOTE — Assessment & Plan Note (Signed)
-  USPSTF grade A and B recommendations reviewed with patient; age-appropriate recommendations, preventive care, screening tests, etc discussed and encouraged; healthy living encouraged; see AVS for patient education given to patient -Follow up and care instructions discussed and provided in AVS.  -Reviewed Health Maintenance: up to date Continue GYN F/U for annual womens care  Encounter for general adult medical examination with abnormal findings - CBC; Future - Comprehensive metabolic panel; Future - Lipid panel; Future-Screening for cholesterol level - TSH; Future  Obesity (BMI 30.0-34.9) - CBC; Future - Comprehensive metabolic panel; Future - Lipid panel; Future - TSH; Future - VITAMIN D 25 Hydroxy (Vit-D Deficiency, Fractures); Future

## 2018-04-16 NOTE — Assessment & Plan Note (Signed)
Update labs No recent evaluation by specialty, Referral will be made to hematology for further evaluation and management of protein S deficiency/xarelto dosing - CBC; Future - Comprehensive metabolic panel; Future - Ambulatory referral to Hematology / Oncology

## 2018-05-01 ENCOUNTER — Inpatient Hospital Stay: Payer: BLUE CROSS/BLUE SHIELD | Attending: Oncology | Admitting: Hematology and Oncology

## 2018-05-04 ENCOUNTER — Encounter: Payer: Self-pay | Admitting: Nurse Practitioner

## 2018-05-04 ENCOUNTER — Ambulatory Visit: Payer: BLUE CROSS/BLUE SHIELD | Admitting: Nurse Practitioner

## 2018-05-04 VITALS — BP 128/88 | HR 90 | Temp 98.1°F | Ht 66.0 in | Wt 208.0 lb

## 2018-05-04 DIAGNOSIS — J209 Acute bronchitis, unspecified: Secondary | ICD-10-CM

## 2018-05-04 MED ORDER — AZITHROMYCIN 250 MG PO TABS: ORAL_TABLET | ORAL | 0 refills | Status: DC

## 2018-05-04 MED ORDER — TERCONAZOLE 0.8 % VA CREA: 1.0000 | TOPICAL_CREAM | Freq: Every day | VAGINAL | 0 refills | Status: DC

## 2018-05-04 NOTE — Progress Notes (Signed)
Belinda Adams is a 47 y.o. female with the following history as recorded in EpicCare:  Patient Active Problem List   Diagnosis Date Noted  . Vitamin D deficiency 04/16/2018  . Encounter for general adult medical examination with abnormal findings 04/16/2018  . Current use of long term anticoagulation 08/08/2017  . GERD (gastroesophageal reflux disease) 08/08/2017  . Allergic rhinitis 09/08/2016  . Elevated blood pressure reading 04/18/2013  . DVT, recurrent, lower extremity, chronic (HCC) 02/19/2013  . Protein S deficiency (HCC) 02/19/2013    Current Outpatient Medications  Medication Sig Dispense Refill  . fluticasone (FLONASE) 50 MCG/ACT nasal spray Place 2 sprays into both nostrils daily. 16 g 5  . hydrOXYzine (ATARAX/VISTARIL) 10 MG tablet Take 1 tablet (10 mg total) by mouth every 8 (eight) hours as needed for itching (allergies). 90 tablet 1  . pantoprazole (PROTONIX) 40 MG tablet Take 1 tablet (40 mg total) by mouth daily. 90 tablet 1  . rivaroxaban (XARELTO) 20 MG TABS tablet Take 1 tablet (20 mg total) by mouth daily with supper. 30 tablet 3  . Vitamin D, Ergocalciferol, (DRISDOL) 50000 units CAPS capsule Take 1 capsule (50,000 Units total) by mouth every 7 (seven) days. 12 capsule 0  . azithromycin (ZITHROMAX) 250 MG tablet Take 2 tablets today then 1 tablet daily until complete 6 tablet 0  . terconazole (TERAZOL 3) 0.8 % vaginal cream Place 1 applicator vaginally at bedtime. For 3 days. 20 g 0   No current facility-administered medications for this visit.     Allergies: Doxycycline; Histamine; Latex; Metronidazole; Penicillins; Tetracyclines & related; Tyloxapol; Fluconazole; and Hyoscyamine  Past Medical History:  Diagnosis Date  . Allergy   . DVT (deep venous thrombosis) (HCC)   . Environmental allergies   . PE (pulmonary embolism)   . Protein S deficiency (HCC)     History reviewed. No pertinent surgical history.  Family History  Problem Relation Age of Onset  .  Hypertension Mother   . Fibromyalgia Mother   . Diabetes Daughter     Social History   Tobacco Use  . Smoking status: Never Smoker  . Smokeless tobacco: Never Used  Substance Use Topics  . Alcohol use: No     Subjective:  Belinda Adams is here today requesting evaluation of acute complaint of cough/cold symptoms, which first began about 6 days ago this past Saturday.  Reports: headache, malaise, body aches, sinus pain and pressure, postnasal drip, nasal congestion, clear nasal drainage, sore throat, dry cough Denies: fevers, chills, syncope, confusion, chest pain, shortness of breath, wheezing, abdominal pain, nausea,vomiting, bowel or bladder changes Smoker: no Tried at home: warm salt and water, flonase, mucinex, hydroxyzine, hot tea Feels like her symptoms are getting worse since onset  ROS: See HPI  Objective:  Vitals:   05/04/18 1523  BP: 128/88  Pulse: 90  Temp: 98.1 F (36.7 C)  TempSrc: Oral  SpO2: 97%  Weight: 208 lb (94.3 kg)  Height: 5\' 6"  (1.676 m)    General: Well developed, well nourished, in no acute distress  Skin : Warm and dry.  Head: Normocephalic and atraumatic  Eyes: Sclera and conjunctiva clear; pupils round and reactive to light; extraocular movements intact  Ears: External normal; canals clear; tympanic membranes bulging Oropharynx: Pink, supple. No suspicious lesions  Neck: Supple without thyromegaly, adenopathy  Lungs: Respirations unlabored; clear to auscultation bilaterally without wheeze, rales, rhonchi  CVS exam: normal rate, regular rhythm, normal S1, S2 Extremities: No edema, cyanosis Vessels: Symmetric bilaterally  Neurologic: Alert and oriented; speech intact; face symmetrical; moves all extremities well; CNII-XII intact without focal deficit   Assessment:  1. Acute bronchitis, unspecified organism      Plan:  Due to duration and worsening of symptoms, will start antibiotic course.  terazol sent at her request if she develops  symptoms of yeast infection after antibiotic course Home management, red flags and return precautions including when to seek immediate care discussed and printed on AVS   No follow-ups on file.  No orders of the defined types were placed in this encounter.   Requested Prescriptions   Signed Prescriptions Disp Refills  . azithromycin (ZITHROMAX) 250 MG tablet 6 tablet 0    Sig: Take 2 tablets today then 1 tablet daily until complete  . terconazole (TERAZOL 3) 0.8 % vaginal cream 20 g 0    Sig: Place 1 applicator vaginally at bedtime. For 3 days.

## 2018-05-04 NOTE — Patient Instructions (Addendum)
Take z-pak as prescribed  terazol cream sent in case you get a yeast infection  Please follow up for fevers over 101, if your symptoms get worse, or if your symptoms dont get better with the antibiotic.   Acute Bronchitis, Adult Acute bronchitis is when air tubes (bronchi) in the lungs suddenly get swollen. The condition can make it hard to breathe. It can also cause these symptoms:  A cough.  Coughing up clear, yellow, or green mucus.  Wheezing.  Chest congestion.  Shortness of breath.  A fever.  Body aches.  Chills.  A sore throat.  Follow these instructions at home: Medicines  Take over-the-counter and prescription medicines only as told by your doctor.  If you were prescribed an antibiotic medicine, take it as told by your doctor. Do not stop taking the antibiotic even if you start to feel better. General instructions  Rest.  Drink enough fluids to keep your pee (urine) clear or pale yellow.  Avoid smoking and secondhand smoke. If you smoke and you need help quitting, ask your doctor. Quitting will help your lungs heal faster.  Use an inhaler, cool mist vaporizer, or humidifier as told by your doctor.  Keep all follow-up visits as told by your doctor. This is important. How is this prevented? To lower your risk of getting this condition again:  Wash your hands often with soap and water. If you cannot use soap and water, use hand sanitizer.  Avoid contact with people who have cold symptoms.  Try not to touch your hands to your mouth, nose, or eyes.  Make sure to get the flu shot every year.  Contact a doctor if:  Your symptoms do not get better in 2 weeks. Get help right away if:  You cough up blood.  You have chest pain.  You have very bad shortness of breath.  You become dehydrated.  You faint (pass out) or keep feeling like you are going to pass out.  You keep throwing up (vomiting).  You have a very bad headache.  Your fever or chills  gets worse. This information is not intended to replace advice given to you by your health care provider. Make sure you discuss any questions you have with your health care provider. Document Released: 12/28/2007 Document Revised: 02/17/2016 Document Reviewed: 12/30/2015 Elsevier Interactive Patient Education  [employer].

## 2018-05-14 ENCOUNTER — Telehealth: Payer: Self-pay | Admitting: Nurse Practitioner

## 2018-05-14 MED ORDER — AZITHROMYCIN 250 MG PO TABS: ORAL_TABLET | ORAL | 0 refills | Status: DC

## 2018-05-14 NOTE — Telephone Encounter (Signed)
Lets try repeating the z-pak, this has been sent If she continues to have symptoms after that, please have her schedule follow up OV

## 2018-05-14 NOTE — Telephone Encounter (Signed)
Copied from CRM [id number]. Topic: Quick Communication - See Telephone Encounter >> May 14, 2018  1:25 PM Angela Nevin wrote: CRM for notification. See Telephone encounter for: 05/14/18.  Pt states she was seen 10/11 and prescribed azithromycin (ZITHROMAX) 250 MG tablet and advised to contact office if symptoms did not subside- pt states that she is still experiencing symptoms such as persistent headache, sore throat and congestion. Pt is requesting a call back to discuss whether something could be sent into pharmacy or if she needs additional OV  . Please advise.

## 2018-05-15 NOTE — Telephone Encounter (Signed)
Left mess for patient to call back. CRM created.  

## 2018-05-18 NOTE — Telephone Encounter (Signed)
I called patient again. No answer/unable to leave vm. Will try again later.

## 2018-05-22 ENCOUNTER — Telehealth: Payer: Self-pay | Admitting: Nurse Practitioner

## 2018-05-22 NOTE — Telephone Encounter (Signed)
Copied from CRM [id number]. Topic: General - Inquiry >> May 22, 2018  5:26 PM Maia Petties wrote: Reason for CRM: pt is thinking she has BV not a yeast infection. She is having itching on the outside of her vagina. She said her skin is chafing on the outside and she has clear/white discharge. Pt notes no odor. She has completed 2 rounds of ABX. Pt asking for a medication to be sent in for her.  Pt is going out of town on Friday.  [id number] - [place], [place] - [address] AT [facility] OF [phone] (Phone) [phone] (Fax)

## 2018-05-23 NOTE — Telephone Encounter (Signed)
Would you like for patient to make an appointment?

## 2018-05-23 NOTE — Telephone Encounter (Signed)
Yes, appointment please

## 2018-05-25 NOTE — Telephone Encounter (Signed)
Unable to LVM.

## 2018-06-29 ENCOUNTER — Other Ambulatory Visit (INDEPENDENT_AMBULATORY_CARE_PROVIDER_SITE_OTHER): Payer: 59

## 2018-06-29 DIAGNOSIS — E559 Vitamin D deficiency, unspecified: Secondary | ICD-10-CM

## 2018-06-29 LAB — VITAMIN D 25 HYDROXY (VIT D DEFICIENCY, FRACTURES): VITD: 27.85 ng/mL — ABNORMAL LOW (ref 30.00–100.00)

## 2018-07-02 ENCOUNTER — Other Ambulatory Visit: Payer: Self-pay | Admitting: Family

## 2018-07-02 DIAGNOSIS — E559 Vitamin D deficiency, unspecified: Secondary | ICD-10-CM

## 2018-07-02 MED ORDER — VITAMIN D (ERGOCALCIFEROL) 1.25 MG (50000 UNIT) PO CAPS: 50000.0000 [IU] | ORAL_CAPSULE | ORAL | 0 refills | Status: DC

## 2018-07-05 DIAGNOSIS — Z719 Counseling, unspecified: Secondary | ICD-10-CM | POA: Diagnosis not present

## 2018-07-10 DIAGNOSIS — Z719 Counseling, unspecified: Secondary | ICD-10-CM | POA: Diagnosis not present

## 2018-07-16 DIAGNOSIS — Z719 Counseling, unspecified: Secondary | ICD-10-CM | POA: Diagnosis not present

## 2018-07-31 ENCOUNTER — Ambulatory Visit: Payer: 59 | Admitting: Family

## 2018-07-31 ENCOUNTER — Encounter: Payer: Self-pay | Admitting: Family

## 2018-07-31 VITALS — BP 142/90 | HR 74 | Temp 98.4°F | Ht 66.0 in | Wt 206.1 lb

## 2018-07-31 DIAGNOSIS — J019 Acute sinusitis, unspecified: Secondary | ICD-10-CM | POA: Diagnosis not present

## 2018-07-31 MED ORDER — LEVOFLOXACIN 500 MG PO TABS: 500.0000 mg | ORAL_TABLET | Freq: Every day | ORAL | 0 refills | Status: DC

## 2018-07-31 MED ORDER — TERCONAZOLE 0.8 % VA CREA: 1.0000 | TOPICAL_CREAM | Freq: Every day | VAGINAL | 0 refills | Status: AC

## 2018-07-31 NOTE — Patient Instructions (Signed)
Please call your allergist today;

## 2018-07-31 NOTE — Progress Notes (Signed)
Belinda Adams is a 48 y.o. female with the following history as recorded in EpicCare:  Patient Active Problem List   Diagnosis Date Noted  . Vitamin D deficiency 04/16/2018  . Encounter for general adult medical examination with abnormal findings 04/16/2018  . Current use of long term anticoagulation 08/08/2017  . GERD (gastroesophageal reflux disease) 08/08/2017  . Allergic rhinitis 09/08/2016  . Elevated blood pressure reading 04/18/2013  . DVT, recurrent, lower extremity, chronic (HCC) 02/19/2013  . Protein S deficiency (HCC) 02/19/2013    Current Outpatient Medications  Medication Sig Dispense Refill  . fluticasone (FLONASE) 50 MCG/ACT nasal spray Place 2 sprays into both nostrils daily. 16 g 5  . hydrOXYzine (ATARAX/VISTARIL) 10 MG tablet Take 1 tablet (10 mg total) by mouth every 8 (eight) hours as needed for itching (allergies). 90 tablet 1  . pantoprazole (PROTONIX) 40 MG tablet Take 1 tablet (40 mg total) by mouth daily. 90 tablet 1  . rivaroxaban (XARELTO) 20 MG TABS tablet Take 1 tablet (20 mg total) by mouth daily with supper. 30 tablet 3  . terconazole (TERAZOL 3) 0.8 % vaginal cream Place 1 applicator vaginally at bedtime. For 3 days. 20 g 0  . levofloxacin (LEVAQUIN) 500 MG tablet Take 1 tablet (500 mg total) by mouth daily. 10 tablet 0   No current facility-administered medications for this visit.     Allergies: Doxycycline; Histamine; Latex; Metronidazole; Penicillins; Tetracyclines & related; Tyloxapol; Fluconazole; and Hyoscyamine  Past Medical History:  Diagnosis Date  . Allergy   . DVT (deep venous thrombosis) (HCC)   . Environmental allergies   . PE (pulmonary embolism)   . Protein S deficiency (HCC)     History reviewed. No pertinent surgical history.  Family History  Problem Relation Age of Onset  . Hypertension Mother   . Fibromyalgia Mother   . Diabetes Daughter     Social History   Tobacco Use  . Smoking status: Never Smoker  . Smokeless  tobacco: Never Used  Substance Use Topics  . 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  Head: Normocephalic and atraumatic  Eyes: Sclera and conjunctiva clear; pupils round and reactive to light; extraocular movements intact  Ears: External normal; canals clear; tympanic membranes normal  Oropharynx: Pink, supple. No suspicious lesions  Neck: Supple without thyromegaly, adenopathy  Lungs: Respirations unlabored; clear to auscultation bilaterally without wheeze, rales, rhonchi  CVS exam: normal rate and regular rhythm.  Abdomen: Soft; nontender; nondistended; normoactive bowel sounds; no masses or hepatosplenomegaly  Musculoskeletal: No deformities; no active joint inflammation  Extremities: No edema, cyanosis, clubbing  Vessels: Symmetric bilaterally  Neurologic:  Alert and oriented; speech intact; face symmetrical; moves all extremities well; CNII-XII intact without focal deficit   Assessment:  1. Acute sinusitis, recurrence not specified, unspecified location     Plan:  Rx for Levaquin 500 mg qd x 10 days; she is encouraged to schedule a follow-up with her allergist due to recurrent infections in the past year- she  agrees;  No follow-ups on file.  No orders of the defined types were placed in this encounter.   Requested Prescriptions   Signed Prescriptions Disp Refills  . levofloxacin (LEVAQUIN) 500 MG tablet 10 tablet 0    Sig: Take 1 tablet (500 mg total) by mouth daily.  Marland Kitchen terconazole (TERAZOL 3) 0.8 % vaginal cream 20 g 0    Sig: Place 1 applicator vaginally at bedtime. For 3 days.

## 2018-08-01 DIAGNOSIS — N898 Other specified noninflammatory disorders of vagina: Secondary | ICD-10-CM | POA: Diagnosis not present

## 2018-08-10 DIAGNOSIS — J385 Laryngeal spasm: Secondary | ICD-10-CM | POA: Diagnosis not present

## 2018-09-10 ENCOUNTER — Ambulatory Visit: Payer: BLUE CROSS/BLUE SHIELD | Admitting: Nurse Practitioner

## 2018-10-12 ENCOUNTER — Other Ambulatory Visit (INDEPENDENT_AMBULATORY_CARE_PROVIDER_SITE_OTHER): Payer: 59

## 2018-10-12 ENCOUNTER — Other Ambulatory Visit: Payer: Self-pay

## 2018-10-12 ENCOUNTER — Encounter: Payer: Self-pay | Admitting: Nurse Practitioner

## 2018-10-12 ENCOUNTER — Ambulatory Visit: Payer: 59 | Admitting: Nurse Practitioner

## 2018-10-12 VITALS — BP 140/100 | HR 102 | Temp 98.0°F | Ht 66.0 in | Wt 209.0 lb

## 2018-10-12 DIAGNOSIS — Z7901 Long term (current) use of anticoagulants: Secondary | ICD-10-CM

## 2018-10-12 DIAGNOSIS — D6859 Other primary thrombophilia: Secondary | ICD-10-CM

## 2018-10-12 DIAGNOSIS — J309 Allergic rhinitis, unspecified: Secondary | ICD-10-CM | POA: Diagnosis not present

## 2018-10-12 DIAGNOSIS — E559 Vitamin D deficiency, unspecified: Secondary | ICD-10-CM | POA: Diagnosis not present

## 2018-10-12 DIAGNOSIS — B001 Herpesviral vesicular dermatitis: Secondary | ICD-10-CM

## 2018-10-12 DIAGNOSIS — R030 Elevated blood-pressure reading, without diagnosis of hypertension: Secondary | ICD-10-CM

## 2018-10-12 LAB — COMPREHENSIVE METABOLIC PANEL
ALT: 18 U/L (ref 0–35)
AST: 16 U/L (ref 0–37)
Albumin: 4.3 g/dL (ref 3.5–5.2)
Alkaline Phosphatase: 74 U/L (ref 39–117)
BUN: 10 mg/dL (ref 6–23)
CO2: 29 mEq/L (ref 19–32)
Calcium: 10.2 mg/dL (ref 8.4–10.5)
Chloride: 104 mEq/L (ref 96–112)
Creatinine, Ser: 0.83 mg/dL (ref 0.40–1.20)
GFR: 88.18 mL/min (ref >60.00)
Glucose, Bld: 90 mg/dL (ref 70–99)
Potassium: 3.9 mEq/L (ref 3.5–5.1)
Sodium: 141 mEq/L (ref 135–145)
Total Bilirubin: 0.4 mg/dL (ref 0.2–1.2)
Total Protein: 7.7 g/dL (ref 6.0–8.3)

## 2018-10-12 LAB — CBC
HCT: 40.4 % (ref 36.0–46.0)
Hemoglobin: 13.4 g/dL (ref 12.0–15.0)
MCHC: 33.2 g/dL (ref 30.0–36.0)
MCV: 88.8 fl (ref 78.0–100.0)
Platelets: 310 10*3/uL (ref 150.0–400.0)
RBC: 4.55 Mil/uL (ref 3.87–5.11)
RDW: 14.4 % (ref 11.5–15.5)
WBC: 5.3 10*3/uL (ref 4.0–10.5)

## 2018-10-12 LAB — VITAMIN D 25 HYDROXY (VIT D DEFICIENCY, FRACTURES): VITD: 26.53 ng/mL — AB (ref 30.00–100.00)

## 2018-10-12 MED ORDER — FLUTICASONE PROPIONATE 50 MCG/ACT NA SUSP: 2.0000 | Freq: Every day | NASAL | 5 refills | Status: AC

## 2018-10-12 MED ORDER — RIVAROXABAN 20 MG PO TABS: 20.0000 mg | ORAL_TABLET | Freq: Every day | ORAL | 3 refills | Status: DC

## 2018-10-12 MED ORDER — VALACYCLOVIR HCL 1 G PO TABS: 2000.0000 mg | ORAL_TABLET | Freq: Two times a day (BID) | ORAL | 0 refills | Status: AC

## 2018-10-12 NOTE — Assessment & Plan Note (Signed)
Update labs F/U with further recommendations pending lab results - VITAMIN D 25 Hydroxy (Vit-D Deficiency, Fractures); Future 

## 2018-10-12 NOTE — Assessment & Plan Note (Signed)
Stable Continue current medications F/u for new, worsening symptoms - fluticasone (FLONASE) 50 MCG/ACT nasal spray; Place 2 sprays into both nostrils daily.  Dispense: 16 g; Refill: 5

## 2018-10-12 NOTE — Progress Notes (Signed)
Belinda Adams is a 48 y.o. female with the following history as recorded in EpicCare:  Patient Active Problem List   Diagnosis Date Noted  . Vitamin D deficiency 04/16/2018  . Encounter for general adult medical examination with abnormal findings 04/16/2018  . Current use of long term anticoagulation 08/08/2017  . GERD (gastroesophageal reflux disease) 08/08/2017  . Allergic rhinitis 09/08/2016  . Elevated blood pressure reading 04/18/2013  . DVT, recurrent, lower extremity, chronic (HCC) 02/19/2013  . Protein S deficiency (HCC) 02/19/2013    Current Outpatient Medications  Medication Sig Dispense Refill  . fluticasone (FLONASE) 50 MCG/ACT nasal spray Place 2 sprays into both nostrils daily. 16 g 5  . hydrOXYzine (ATARAX/VISTARIL) 10 MG tablet Take 1 tablet (10 mg total) by mouth every 8 (eight) hours as needed for itching (allergies). 90 tablet 1  . levocetirizine (XYZAL) 5 MG tablet Take 1 tablet by mouth daily.    . montelukast (SINGULAIR) 10 MG tablet Take 1 tablet by mouth daily.    . pantoprazole (PROTONIX) 40 MG tablet Take 1 tablet (40 mg total) by mouth daily. 90 tablet 1  . rivaroxaban (XARELTO) 20 MG TABS tablet Take 1 tablet (20 mg total) by mouth daily with supper. 30 tablet 3  . terconazole (TERAZOL 3) 0.8 % vaginal cream Place 1 applicator vaginally at bedtime. For 3 days. 20 g 0  . valACYclovir (VALTREX) 1000 MG tablet Take 2 tablets (2,000 mg total) by mouth 2 (two) times daily. 2000 mg twice daily for 1 day, Start ASAP on onset of symptoms 20 tablet 0   No current facility-administered medications for this visit.     Allergies: Doxycycline; Histamine; Latex; Metronidazole; Penicillins; Tetracyclines & related; Tyloxapol; Fluconazole; and Hyoscyamine  Past Medical History:  Diagnosis Date  . Allergy   . DVT (deep venous thrombosis) (HCC)   . Environmental allergies   . PE (pulmonary embolism)   . Protein S deficiency (HCC)     History reviewed. No pertinent  surgical history.  Family History  Problem Relation Age of Onset  . Hypertension Mother   . Fibromyalgia Mother   . Diabetes Daughter     Social History   Tobacco Use  . Smoking status: Never Smoker  . Smokeless tobacco: Never Used  Substance Use Topics  . Alcohol use: No     Subjective:  Belinda Adams is here today for routine follow up, she is requesting xarelto and flonase refills. She is also requesting "something for fever blisters"- reports fever blister to lips about once every 6 months or less, has been occurring for years now, describes as "tingling burning sore", resolves in a few days, she was on valtrex in the past PRN and would like a new Rx.  She has seasonal allergies, uses flonase prn with good control of symptoms She has now completed a second 12 week course of vitamin D 65465 iu once weekly for 12 weeks, due to low vitamin D levels on 03/09/18 and 06/29/18 labs. Her blood pressure remains elevated today, at her CPE on 03/09/18, she felt her elevated BP was related to xarelto dosage, she is taking xarelto 20 daily for protein S deficiency for some time now, no recent heme/onc eval so I placed a referral to update evaluation with heme/onc on 03/09/18, however she scheduled but missed her appointment then says she could not get back in touch with the office to reschedule, requests another referral today. She also tells me that she has been checking her BP  readings recently at home, readings are always <140/80, she thinks she is just nervous about her medical appointment and that makes her BP go up. She also tells me that she has been exercising 3 times a week to improve her health. Denies confusion, syncope, vision changes, chest pain, shortness of breath, edema, abnormal bruising or bleeding  BP Readings from Last 3 Encounters:  10/12/18 (!) 140/100  07/31/18 (!) 142/90  05/04/18 128/88   ROS- See HPI  Objective:  Vitals:   10/12/18 1302  BP: (!) 140/100  Pulse: (!) 102   Temp: 98 F (36.7 C)  TempSrc: Oral  SpO2: 96%  Weight: 209 lb (94.8 kg)  Height: 5\' 6"  (1.676 m)    General: Well developed, well nourished, in no acute distress  Skin : Warm and dry.  Head: Normocephalic and atraumatic  Eyes: Sclera and conjunctiva clear; pupils round and reactive to light; extraocular movements intact  Oropharynx: Pink, supple. No suspicious lesions  Neck: Supple  Lungs: Effort unlabored, no respiratory distress CVS exam: Normal rate and regular rhythm.  Extremities: No edema, cyanosis, clubbing  Vessels: Symmetric bilaterally  Neurologic: Alert and oriented; speech intact; face symmetrical; moves all extremities well; CNII-XII intact without focal deficit  Psychiatric: Normal mood and affect.  Assessment:  1. Vitamin D deficiency   2. Current use of long term anticoagulation   3. Protein S deficiency (HCC)   4. Chronic allergic rhinitis   5. Fever blister   6. Elevated blood pressure reading     Plan:   Return in about 6 months (around 04/14/2019) for CPE.

## 2018-10-12 NOTE — Assessment & Plan Note (Addendum)
Valtrex PRN sent-medication dosing, side effects discussed Home management, red flags and return precautions including when to seek immediate care discussed and printed on AVS She will F/U for new, worsening symptoms or increased frequency of cold sore outbreaks - valACYclovir (VALTREX) 1000 MG tablet; Take 2 tablets (2,000 mg total) by mouth 2 (two) times daily. 2000 mg twice daily for 1 day, Start ASAP on onset of symptoms  Dispense: 20 tablet; Refill: 0

## 2018-10-12 NOTE — Assessment & Plan Note (Signed)
Continues to have high readings in clinic, but reports normal readings at home  She will continue to monitor home readings F/U for readings >140/90

## 2018-10-12 NOTE — Assessment & Plan Note (Signed)
Continue xarelto at current dosage Update labs Repeat Referral will be made to hematology for further evaluation and management of protein S deficiency  - rivaroxaban (XARELTO) 20 MG TABS tablet; Take 1 tablet (20 mg total) by mouth daily with supper.  Dispense: 30 tablet; Refill: 3 - CBC; Future - Comprehensive metabolic panel; Future - Ambulatory referral to Hematology 

## 2018-10-12 NOTE — Assessment & Plan Note (Addendum)
Continue xarelto at current dosage Update labs Repeat Referral will be made to hematology for further evaluation and management of protein S deficiency  - rivaroxaban (XARELTO) 20 MG TABS tablet; Take 1 tablet (20 mg total) by mouth daily with supper.  Dispense: 30 tablet; Refill: 3 - CBC; Future - Comprehensive metabolic panel; Future - Ambulatory referral to Hematology

## 2018-10-12 NOTE — Patient Instructions (Signed)
Head downstairs for labs today  You will be contacted about the referral for hematology  I have sent a prescription for valtrex as discussed. Please follow up for worsening or increased frequency of fever blisters.   Cold Sore  A cold sore, also called a fever blister, is a small, fluid-filled sore that forms inside of the mouth or on the lips, gums, nose, chin, or cheeks. Cold sores can spread to other parts of the body, such as the eyes or fingers. Cold sores can spread from person to person (are contagious) until the sores crust over completely. Most cold sores go away within 2 weeks. What are the causes? Cold sores are caused by a virus (herpes simplex virus type 1, HSV-1). The virus can spread from person to person through close contact, such as through:  Kissing.  Touching the affected area.  Sharing personal items such as lip balm, razors, a drinking glass, or eating utensils. What increases the risk? You are more likely to develop this condition if you:  Are tired, stressed, or sick.  Are having your period (menstruating).  Are pregnant.  Take certain medicines.  Are out in cold weather or get too much sun. What are the signs or symptoms? Symptoms of a cold sore outbreak go through different stages. These are the stages of a cold sore:  Tingling, itching, or burning is felt 1-2 days before the outbreak.  Fluid-filled blisters appear on the lips, inside the mouth, on the nose, or on the cheeks.  The blisters start to ooze clear fluid.  The blisters dry up, and a yellow crust appears in their place.  The crust falls off. In some cases, other symptoms can develop during a cold sore outbreak. These can include:  Fever.  Sore throat.  Headache.  Muscle aches.  Swollen neck glands. How is this treated? There is no cure for cold sores or the virus that causes them. There is also no vaccine to prevent the virus. Most cold sores go away on their own without  treatment within 2 weeks. Your doctor may prescribe medicines to:  Help with pain.  Keep the virus from growing.  Help you heal faster. Medicines may be in the form of creams, gels, pills, or a shot. Follow these instructions at home: Medicines  Take or apply over-the-counter and prescription medicines only as told by your doctor.  Use a cotton-tip swab to apply creams or gels to your sores.  Ask your doctor if you can take lysine supplements. These may help with healing. Sore care   Do not touch the sores or pick the scabs.  Wash your hands often. Do not touch your eyes without washing your hands first.  Keep the sores clean and dry.  If told, put ice on the sores: ? Put ice in a plastic bag. ? Place a towel between your skin and the bag. ? Leave the ice on for 20 minutes, 2-3 times a day. Eating and drinking  Eat a soft, bland diet. Avoid eating hot, cold, or salty foods. These can hurt your mouth.  Use a straw if it hurts to drink out of a glass.  Eat foods that have a lot of lysine in them. These include meat, fish, and dairy products.  Avoid sugary foods, chocolates, nuts, and grains. These foods have a high amount of a substance (arginine) that can cause the virus to grow. Lifestyle  Do not kiss, have oral sex, or share personal items until your sores  heal.  Stress, poor sleep, and being out in the sun can trigger outbreaks. Make sure you: ? Do activities that help you relax, such as deep breathing exercises or meditation. ? Get enough sleep. ? Apply sunscreen on your lips before you go out in the sun. Contact a doctor if:  You have symptoms for more than 2 weeks.  You have pus coming from the sores.  You have redness that is spreading.  You have pain or irritation in your eye.  You get sores on your genitals.  Your sores do not heal within 2 weeks.  You get cold sores often. Get help right away if:  You have a fever and your symptoms suddenly get  worse.  You have a headache and confusion.  You have tiredness (fatigue).  You do not want to eat as much as normal (loss of appetite).  You have a stiff neck or are sensitive to light. Summary  A cold sore is a small, fluid-filled sore that forms inside of the mouth or on the lips, gums, nose, chin, or cheeks.  Cold sores can spread from person to person (are contagious) until the sores crust over completely. Most cold sores go away within 2 weeks.  Wash your hands often. Do not touch your eyes without washing your hands first.  Do not kiss, have oral sex, or share personal items until your sores heal.  Contact a doctor if your sores do not heal within 2 weeks. This information is not intended to replace advice given to you by your health care provider. Make sure you discuss any questions you have with your health care provider. Document Released: 01/10/2012 Document Revised: 12/11/2017 Document Reviewed: 12/11/2017 Elsevier Interactive Patient Education  2019 [employer].

## 2018-10-15 ENCOUNTER — Other Ambulatory Visit: Payer: Self-pay | Admitting: Nurse Practitioner

## 2018-10-15 DIAGNOSIS — E559 Vitamin D deficiency, unspecified: Secondary | ICD-10-CM

## 2018-10-15 MED ORDER — VITAMIN D (ERGOCALCIFEROL) 1.25 MG (50000 UNIT) PO CAPS: 50000.0000 [IU] | ORAL_CAPSULE | ORAL | 0 refills | Status: AC

## 2018-10-18 ENCOUNTER — Encounter: Payer: Self-pay | Admitting: Internal Medicine

## 2018-11-23 ENCOUNTER — Telehealth: Payer: Self-pay | Admitting: Internal Medicine

## 2018-11-23 NOTE — Telephone Encounter (Signed)
Pt cld and cancelled appt bc we aren't in network with her insurance.

## 2018-11-26 ENCOUNTER — Inpatient Hospital Stay: Payer: Self-pay | Admitting: Internal Medicine

## 2019-04-12 ENCOUNTER — Other Ambulatory Visit: Payer: Self-pay | Admitting: Family

## 2019-04-12 DIAGNOSIS — D6859 Other primary thrombophilia: Secondary | ICD-10-CM

## 2019-04-12 DIAGNOSIS — Z7901 Long term (current) use of anticoagulants: Secondary | ICD-10-CM

## 2019-04-12 MED ORDER — RIVAROXABAN 20 MG PO TABS: 20.0000 mg | ORAL_TABLET | Freq: Every day | ORAL | 1 refills | Status: AC

## 2020-04-11 ENCOUNTER — Other Ambulatory Visit: Payer: Self-pay

## 2020-10-09 DIAGNOSIS — J385 Laryngeal spasm: Secondary | ICD-10-CM | POA: Diagnosis not present

## 2020-11-06 DIAGNOSIS — J385 Laryngeal spasm: Secondary | ICD-10-CM | POA: Diagnosis not present

## 2021-01-03 DIAGNOSIS — J0140 Acute pansinusitis, unspecified: Secondary | ICD-10-CM | POA: Diagnosis not present

## 2021-01-13 DIAGNOSIS — J385 Laryngeal spasm: Secondary | ICD-10-CM | POA: Diagnosis not present

## 2021-01-13 DIAGNOSIS — J383 Other diseases of vocal cords: Secondary | ICD-10-CM | POA: Diagnosis not present

## 2021-01-13 DIAGNOSIS — R490 Dysphonia: Secondary | ICD-10-CM | POA: Diagnosis not present
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
## 2024-04-28 ENCOUNTER — Emergency Department (HOSPITAL_COMMUNITY): Payer: Self-pay

## 2024-04-28 ENCOUNTER — Emergency Department (HOSPITAL_COMMUNITY)
Admission: EM | Admit: 2024-04-28 | Discharge: 2024-04-28 | Disposition: A | Discharge status: Home / Self Care | Payer: Self-pay | Attending: Emergency Medicine | Admitting: Emergency Medicine

## 2024-04-28 ENCOUNTER — Encounter (HOSPITAL_COMMUNITY): Payer: Self-pay

## 2024-04-28 ENCOUNTER — Other Ambulatory Visit: Payer: Self-pay

## 2024-04-28 DIAGNOSIS — Z91040 Latex allergy status: Secondary | ICD-10-CM | POA: Diagnosis not present

## 2024-04-28 DIAGNOSIS — M546 Pain in thoracic spine: Secondary | ICD-10-CM | POA: Insufficient documentation

## 2024-04-28 DIAGNOSIS — Y92410 Unspecified street and highway as the place of occurrence of the external cause: Secondary | ICD-10-CM | POA: Insufficient documentation

## 2024-04-28 DIAGNOSIS — R0602 Shortness of breath: Secondary | ICD-10-CM | POA: Diagnosis not present

## 2024-04-28 DIAGNOSIS — Z7901 Long term (current) use of anticoagulants: Secondary | ICD-10-CM | POA: Diagnosis not present

## 2024-04-28 LAB — CBC
HCT: 43.2 % (ref 36.0–46.0)
Hemoglobin: 13.8 g/dL (ref 12.0–15.0)
MCH: 29.4 pg (ref 26.0–34.0)
MCHC: 31.9 g/dL (ref 30.0–36.0)
MCV: 92.1 fL (ref 80.0–100.0)
Platelets: 306 K/uL (ref 150–400)
RBC: 4.69 MIL/uL (ref 3.87–5.11)
RDW: 13.9 % (ref 11.5–15.5)
WBC: 6 K/uL (ref 4.0–10.5)
nRBC: 0 % (ref 0.0–0.2)

## 2024-04-28 LAB — BASIC METABOLIC PANEL WITH GFR
Anion gap: 12 (ref 5–15)
BUN: 8 mg/dL (ref 6–20)
CO2: 25 mmol/L (ref 22–32)
Calcium: 9.4 mg/dL (ref 8.9–10.3)
Chloride: 102 mmol/L (ref 98–111)
Creatinine, Ser: 0.73 mg/dL (ref 0.44–1.00)
GFR, Estimated: >60 mL/min (ref >60)
Glucose, Bld: 89 mg/dL (ref 70–99)
Potassium: 3.9 mmol/L (ref 3.5–5.1)
Sodium: 139 mmol/L (ref 135–145)

## 2024-04-28 LAB — PROTIME-INR
INR: 1.1 (ref 0.8–1.2)
Prothrombin Time: 15.1 s (ref 11.4–15.2)

## 2024-04-28 LAB — D-DIMER, QUANTITATIVE: D-Dimer, Quant: <0.27 ug{FEU}/mL (ref 0.00–0.50)

## 2024-04-28 LAB — APTT: aPTT: 32 s (ref 24–36)

## 2024-04-28 MED ORDER — ACETAMINOPHEN 500 MG PO TABS
1000.0000 mg | ORAL_TABLET | Freq: Once | ORAL | Status: AC
  Administered 2024-04-28: 1000 mg via ORAL

## 2024-04-28 NOTE — Discharge Instructions (Signed)
 Lab work and x-rays were reassuring today.  It is advised to continue take muscle relaxer prescribed by primary care and Tylenol  as needed.  You can try heating pad and stretching as well.  It is advised to follow-up with your primary care for further evaluation and management of pain.  If concerning symptoms arise please return to the ED for further evaluation.
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
